# Patient Record
Sex: Male | Born: 1955 | Race: White | Hispanic: No | Marital: Married | Smoking: Former smoker
Health system: Southern US, Community
[De-identification: ages and names within clinical notes are randomized; demographics above are authoritative.]

## PROBLEM LIST (undated history)

## (undated) DIAGNOSIS — N189 Chronic kidney disease, unspecified: Secondary | ICD-10-CM

## (undated) DIAGNOSIS — I10 Essential (primary) hypertension: Secondary | ICD-10-CM

## (undated) DIAGNOSIS — C801 Malignant (primary) neoplasm, unspecified: Secondary | ICD-10-CM

## (undated) DIAGNOSIS — K219 Gastro-esophageal reflux disease without esophagitis: Secondary | ICD-10-CM

## (undated) HISTORY — PX: OTHER SURGICAL HISTORY: SHX169

---

## 2015-03-24 ENCOUNTER — Other Ambulatory Visit (HOSPITAL_COMMUNITY): Payer: Self-pay | Admitting: Urology

## 2015-03-24 DIAGNOSIS — C61 Malignant neoplasm of prostate: Secondary | ICD-10-CM

## 2015-03-26 ENCOUNTER — Other Ambulatory Visit: Payer: Self-pay | Admitting: Urology

## 2015-04-09 ENCOUNTER — Ambulatory Visit (HOSPITAL_COMMUNITY)
Admission: RE | Admit: 2015-04-09 | Discharge: 2015-04-09 | Disposition: A | Discharge status: Home / Self Care | Payer: BLUE CROSS/BLUE SHIELD | Source: Ambulatory Visit | Attending: Urology | Admitting: Urology

## 2015-04-09 DIAGNOSIS — C61 Malignant neoplasm of prostate: Secondary | ICD-10-CM | POA: Insufficient documentation

## 2015-04-09 DIAGNOSIS — K5730 Diverticulosis of large intestine without perforation or abscess without bleeding: Secondary | ICD-10-CM | POA: Insufficient documentation

## 2015-04-09 LAB — POCT I-STAT CREATININE: Creatinine, Ser: 1.4 mg/dL — ABNORMAL HIGH (ref 0.61–1.24)

## 2015-04-09 MED ORDER — GADOBENATE DIMEGLUMINE 529 MG/ML IV SOLN
20.0000 mL | Freq: Once | INTRAVENOUS | Status: AC | PRN
  Administered 2015-04-09: 19 mL via INTRAVENOUS

## 2015-04-16 NOTE — Patient Instructions (Addendum)
Dan Brandt  04/16/2015   Your procedure is scheduled on: Thursday 04/22/14  Report to [facility]  Entrance take Dan Brandt  elevators to 3rd floor to  Dan Brandt at 10:00AM.  Call this number if you have problems the morning of surgery [phone]   Remember: ONLY 1 PERSON MAY GO WITH YOU TO SHORT STAY TO GET  READY MORNING OF Dan Brandt.  Do not eat food or drink liquids :After Midnight.     Take these medicines the morning of surgery with A SIP OF WATER: Omeprazole, Lovastatin, Claritin if needed                               You may not have any metal on your body including hair pins and              piercings  Do not wear jewelry, make-up, lotions, powders or perfumes, deodorant             Do not wear nail polish.  Do not shave  48 hours prior to surgery.              Men may shave face and neck.   Do not bring valuables to the [name].  Contacts, dentures or bridgework may not be worn into surgery.  Leave suitcase in the car. After surgery it may be brought to your room.     Special Instructions: Practice deep breathing and leg exercises              Please read over the following fact sheets you were given: _____________________________________________________________________             [facility] - Preparing for Surgery Before surgery, you can play an important role.  Because skin is not sterile, your skin needs to be as free of germs as possible.  You can reduce the number of germs on your skin by washing with CHG (chlorahexidine gluconate) soap before surgery.  CHG is an antiseptic cleaner which kills germs and bonds with the skin to continue killing germs even after washing. Please DO NOT use if you have an allergy to CHG or antibacterial soaps.  If your skin becomes reddened/irritated stop using the CHG and inform your nurse when you arrive at Short Stay. Do not shave  (including legs and underarms) for at least 48 hours prior to the first CHG shower.  You may shave your face/neck. Please follow these instructions carefully:  1.  Shower with CHG Soap the night before surgery and the  morning of Surgery.  2.  If you choose to wash your hair, wash your hair first as usual with your  normal  shampoo.  3.  After you shampoo, rinse your hair and body thoroughly to remove the  shampoo.                                        4.  Use CHG as you would any other liquid soap.  You can apply chg directly  to the skin and wash  Gently with a scrungie or clean washcloth.  5.  Apply the CHG Soap to your body ONLY FROM THE NECK DOWN.   Do not use on face/ open                           Wound or open sores. Avoid contact with eyes, ears mouth and genitals (private parts).                       Wash face,  Genitals (private parts) with your normal soap.             6.  Wash thoroughly, paying special attention to the area where your surgery  will be performed.  7.  Thoroughly rinse your body with warm water from the neck down.  8.  DO NOT shower/wash with your normal soap after using and rinsing off  the CHG Soap.                9.  Pat yourself dry with a clean towel.            10.  Wear clean pajamas.            11.  Place clean sheets on your bed the night of your first shower and do not  sleep with pets. Day of Surgery : Do not apply any lotions/deodorants the morning of surgery.  Please wear clean clothes to the [facility]/surgery center.  FAILURE TO FOLLOW THESE INSTRUCTIONS MAY RESULT IN THE CANCELLATION OF YOUR SURGERY PATIENT SIGNATURE_________________________________  NURSE SIGNATURE__________________________________  ________________________________________________________________________   Dan Brandt  An incentive spirometer is a tool that can help keep your lungs clear and active. This tool measures how well you are filling  your lungs with each breath. Taking long deep breaths may help reverse or decrease the chance of developing breathing (pulmonary) problems (especially infection) following:  A long period of time when you are unable to move or be active. BEFORE THE PROCEDURE   If the spirometer includes an indicator to show your best effort, your nurse or respiratory therapist will set it to a desired goal.  If possible, sit up straight or lean slightly forward. Try not to slouch.  Hold the incentive spirometer in an upright position. INSTRUCTIONS FOR USE   Sit on the edge of your bed if possible, or sit up as far as you can in bed or on a chair.  Hold the incentive spirometer in an upright position.  Breathe out normally.  Place the mouthpiece in your mouth and seal your lips tightly around it.  Breathe in slowly and as deeply as possible, raising the piston or the ball toward the top of the column.  Hold your breath for 3-5 seconds or for as long as possible. Allow the piston or ball to fall to the bottom of the column.  Remove the mouthpiece from your mouth and breathe out normally.  Rest for a few seconds and repeat Steps 1 through 7 at least 10 times every 1-2 hours when you are awake. Take your time and take a few normal breaths between deep breaths.  The spirometer may include an indicator to show your best effort. Use the indicator as a goal to work toward during each repetition.  After each set of 10 deep breaths, practice coughing to be sure your lungs are clear. If you have an incision (the cut made at the time of surgery),  support your incision when coughing by placing a pillow or rolled up towels firmly against it. Once you are able to get out of bed, walk around indoors and cough well. You may stop using the incentive spirometer when instructed by your caregiver.  RISKS AND COMPLICATIONS  Take your time so you do not get dizzy or light-headed.  If you are in pain, you may need to  take or ask for pain medication before doing incentive spirometry. It is harder to take a deep breath if you are having pain. AFTER USE  Rest and breathe slowly and easily.  It can be helpful to keep track of a log of your progress. Your caregiver can provide you with a simple table to help with this. If you are using the spirometer at home, follow these instructions: [place] IF:   You are having difficultly using the spirometer.  You have trouble using the spirometer as often as instructed.  Your pain medication is not giving enough relief while using the spirometer.  You develop fever of 100.5 F (38.1 C) or higher. SEEK IMMEDIATE MEDICAL CARE IF:   You cough up bloody sputum that had not been present before.  You develop fever of 102 F (38.9 C) or greater.  You develop worsening pain at or near the incision site. MAKE SURE YOU:   Understand these instructions.  Will watch your condition.  Will get help right away if you are not doing well or get worse. Document Released: 08/08/2006 Document Revised: 06/20/2011 Document Reviewed: 10/09/2006 [employer] Patient Information 2014 [employer], [place].   ________________________________________________________________________  WHAT IS A BLOOD TRANSFUSION? Blood Transfusion Information  A transfusion is the replacement of blood or some of its parts. Blood is made up of multiple cells which provide different functions.  Red blood cells carry oxygen and are used for blood loss replacement.  White blood cells fight against infection.  Platelets control bleeding.  Plasma helps clot blood.  Other blood products are available for specialized needs, such as hemophilia or other clotting disorders. BEFORE THE TRANSFUSION  Who gives blood for transfusions?   Healthy volunteers who are fully evaluated to make sure their blood is safe. This is blood bank blood. Transfusion therapy is the safest it has ever been in the  practice of medicine. Before blood is taken from a donor, a complete history is taken to make sure that person has no history of diseases nor engages in risky social behavior (examples are intravenous drug use or sexual activity with multiple partners). The donor's travel history is screened to minimize risk of transmitting infections, such as malaria. The donated blood is tested for signs of infectious diseases, such as HIV and hepatitis. The blood is then tested to be sure it is compatible with you in order to minimize the chance of a transfusion reaction. If you or a relative donates blood, this is often done in anticipation of surgery and is not appropriate for emergency situations. It takes many days to process the donated blood. RISKS AND COMPLICATIONS Although transfusion therapy is very safe and saves many lives, the main dangers of transfusion include:   Getting an infectious disease.  Developing a transfusion reaction. This is an allergic reaction to something in the blood you were given. Every precaution is taken to prevent this. The decision to have a blood transfusion has been considered carefully by your caregiver before blood is given. Blood is not given unless the benefits outweigh the risks. AFTER THE TRANSFUSION  Right after receiving a blood transfusion, you will usually feel much better and more energetic. This is especially true if your red blood cells have gotten low (anemic). The transfusion raises the level of the red blood cells which carry oxygen, and this usually causes an energy increase.  The nurse administering the transfusion will monitor you carefully for complications. HOME CARE INSTRUCTIONS  No special instructions are needed after a transfusion. You may find your energy is better. Speak with your caregiver about any limitations on activity for underlying diseases you may have. SEEK MEDICAL CARE IF:   Your condition is not improving after your transfusion.  You  develop redness or irritation at the intravenous (IV) site. SEEK IMMEDIATE MEDICAL CARE IF:  Any of the following symptoms occur over the next 12 hours:  Shaking chills.  You have a temperature by mouth above 102 F (38.9 C), not controlled by medicine.  Chest, back, or muscle pain.  People around you feel you are not acting correctly or are confused.  Shortness of breath or difficulty breathing.  Dizziness and fainting.  You get a rash or develop hives.  You have a decrease in urine output.  Your urine turns a dark color or changes to pink, red, or brown. Any of the following symptoms occur over the next 10 days:  You have a temperature by mouth above 102 F (38.9 C), not controlled by medicine.  Shortness of breath.  Weakness after normal activity.  The white part of the eye turns yellow (jaundice).  You have a decrease in the amount of urine or are urinating less often.  Your urine turns a dark color or changes to pink, red, or brown. Document Released: 03/25/2000 Document Revised: 06/20/2011 Document Reviewed: 11/12/2007 [facility] Patient Information 2014 [place], [place].  _______________________________________________________________________

## 2015-04-17 ENCOUNTER — Encounter (HOSPITAL_COMMUNITY)
Admission: RE | Admit: 2015-04-17 | Discharge: 2015-04-17 | Disposition: A | Discharge status: Home / Self Care | Payer: BLUE CROSS/BLUE SHIELD | Source: Ambulatory Visit | Attending: Urology | Admitting: Urology

## 2015-04-17 ENCOUNTER — Encounter (HOSPITAL_COMMUNITY): Payer: Self-pay

## 2015-04-17 ENCOUNTER — Ambulatory Visit (HOSPITAL_COMMUNITY)
Admission: RE | Admit: 2015-04-17 | Discharge: 2015-04-17 | Disposition: A | Discharge status: Home / Self Care | Payer: BLUE CROSS/BLUE SHIELD | Source: Ambulatory Visit | Attending: Anesthesiology | Admitting: Anesthesiology

## 2015-04-17 DIAGNOSIS — I10 Essential (primary) hypertension: Secondary | ICD-10-CM | POA: Diagnosis present

## 2015-04-17 DIAGNOSIS — J439 Emphysema, unspecified: Secondary | ICD-10-CM | POA: Insufficient documentation

## 2015-04-17 HISTORY — DX: Malignant (primary) neoplasm, unspecified: C80.1

## 2015-04-17 HISTORY — DX: Gastro-esophageal reflux disease without esophagitis: K21.9

## 2015-04-17 HISTORY — DX: Essential (primary) hypertension: I10

## 2015-04-17 HISTORY — DX: Chronic kidney disease, unspecified: N18.9

## 2015-04-17 LAB — BASIC METABOLIC PANEL
Anion gap: 8 (ref 5–15)
BUN: 25 mg/dL — AB (ref 6–20)
CALCIUM: 9.3 mg/dL (ref 8.9–10.3)
CO2: 24 mmol/L (ref 22–32)
CREATININE: 1.39 mg/dL — AB (ref 0.61–1.24)
Chloride: 106 mmol/L (ref 101–111)
GFR calc Af Amer: >60 mL/min (ref >60)
GFR, EST NON AFRICAN AMERICAN: 54 mL/min — AB (ref >60)
GLUCOSE: 102 mg/dL — AB (ref 65–99)
Potassium: 4.4 mmol/L (ref 3.5–5.1)
SODIUM: 138 mmol/L (ref 135–145)

## 2015-04-17 LAB — CBC
HCT: 40.6 % (ref 39.0–52.0)
Hemoglobin: 13.7 g/dL (ref 13.0–17.0)
MCH: 29.7 pg (ref 26.0–34.0)
MCHC: 33.7 g/dL (ref 30.0–36.0)
MCV: 88.1 fL (ref 78.0–100.0)
PLATELETS: 153 10*3/uL (ref 150–400)
RBC: 4.61 MIL/uL (ref 4.22–5.81)
RDW: 12.9 % (ref 11.5–15.5)
WBC: 4.6 10*3/uL (ref 4.0–10.5)

## 2015-04-17 LAB — ABO/RH: ABO/RH(D): O POS

## 2015-04-17 NOTE — Pre-Procedure Instructions (Signed)
Pt to have EKG and CXR done at today's pre-op visit.  None in past year per pt.

## 2015-04-17 NOTE — Anesthesia Preprocedure Evaluation (Addendum)
Anesthesia Evaluation  Patient identified by MRN, date of birth, ID band Patient awake    Reviewed: Allergy & Precautions, NPO status , Patient's Chart, lab work & pertinent test results  Airway Mallampati: II   Neck ROM: Full    Dental  (+) Dental Advisory Given, Teeth Intact   Pulmonary former smoker (quit 1985),    breath sounds clear to auscultation       Cardiovascular hypertension, Pt. on medications  Rhythm:Regular  EKG 04/2015 mild LVH   Neuro/Psych negative neurological ROS  negative psych ROS   GI/Hepatic Neg liver ROS, GERD  ,  Endo/Other  negative endocrine ROS  Renal/GU Crest 1.4   Prostate CA    Musculoskeletal   Abdominal   Peds  Hematology 13/40   Anesthesia Other Findings   Reproductive/Obstetrics                           Anesthesia Physical Anesthesia Plan  ASA: II  Anesthesia Plan: General   Post-op Pain Management:    Induction: Intravenous  Airway Management Planned: Oral ETT  Additional Equipment:   Intra-op Plan:   Post-operative Plan: Extubation in OR  Informed Consent: I have reviewed the patients History and Physical, chart, labs and discussed the procedure including the risks, benefits and alternatives for the proposed anesthesia with the patient or authorized representative who has indicated his/her understanding and acceptance.     Plan Discussed with:   Anesthesia Plan Comments: (2nd IV after induction)        Anesthesia Quick Evaluation

## 2015-04-17 NOTE — Progress Notes (Signed)
   04/17/15 0830  OBSTRUCTIVE SLEEP APNEA  Have you ever been diagnosed with sleep apnea through a sleep study? No  Do you snore loudly (loud enough to be heard through closed doors)?  1  Do you often feel tired, fatigued, or sleepy during the daytime (such as falling asleep during driving or talking to someone)? 0  Has anyone observed you stop breathing during your sleep? 1  Do you have, or are you being treated for high blood pressure? 1  BMI more than 35 kg/m2? 0  Age > 50 (1-yes) 1  Neck circumference greater than:Male 16 inches or larger, Male 17inches or larger? 0  Male Gender (Yes=1) 1  Obstructive Sleep Apnea Score 5

## 2015-04-22 NOTE — H&P (Signed)
Chief Complaint Prostate Cancer   Reason For Visit Reason for consult: To discuss treatment options for high risk prostate cancer.  Physician requesting consult: Dr. Clyde Lundborg  PCP: Dr. Kern Alberta   History of Present Illness Dan Brandt is a 60 year old gentleman who was found to have an elevated PSA of 7.5 and a left sided prostate nodule prompting a TRUS biopsy of the prostate by Dr. Exie Parody on 02/10/15. 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 He underwent staging studies on 02/24/15 including a bone scan and CT scan. His CT scan demonstrated a 7 mm right common iliac lymph node without pathologically enlarged lymph nodes or other evidence for metastatic disease. 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 He apparently was already started on combined androgen blockade with Lupron 30 mg and bicalutamide 50 mg daily. He has been tolerating therapy relatively well without hot flashes but has had episodes of feeling depressed, significant weight gain, and fatigue/energy loss.     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 Both his parents are still living in one of his main goals is to ensure that he remains viable enough to care for them throughout the remainder of their lives.    TNM stage: cT3a N0 M0  PSA: 7.5  Gleason score: 5+5=10  Biopsy (02/10/15): 13/16 cores positive    Left: 7/8 cores positive with Gleason 8,9, and 10 disease present with high volume percentages and PNI    Right: 6/8 cores positive with Gleason 8,9, and 10 disease present with 20-80% involvement  Prostate volume: 38.2  cc    Nomogram  OC disease: 7%  EPE: 90%  SVI: 50%  LNI: 51%  PFS (surgery): 22% at 4 years, 13% at 9 years    Urinary function: He does have significant lower urinary tract symptoms including nocturia, weak stream, urgency, frequency, and incomplete emptying. IPSS is 27. The symptoms are significantly bothersome to him.  Erectile function: He is sexually inactive. He does have erectile dysfunction. SHIM score is 1.   Past Medical History Problems  1. History of esophageal reflux (Z87.19) 2. History of hyperlipidemia (Z86.39) 3. History of hypertension (Z86.79)  Surgical History Problems  1. History of No Surgical Problems  Current Meds 1. Bicalutamide 50 MG Oral Tablet;  Therapy: (Recorded:12Dec2016) to Recorded 2. Claritin TABS;  Therapy: (Recorded:13Dec2016) to Recorded 3. Lisinopril 20 MG Oral Tablet;  Therapy: (Recorded:12Dec2016) to Recorded 4. Lovastatin 40 MG Oral Tablet;  Therapy: (Recorded:12Dec2016) to Recorded 5. Omeprazole 20 MG Oral Tablet Delayed Release;  Therapy: (Recorded:12Dec2016) to Recorded 6. Tamsulosin HCl - 0.4 MG Oral Capsule;  Therapy: (Recorded:12Dec2016) to Recorded  Allergies Medication  1. No Known Drug Allergies  Family History Problems  1. Family history of cardiac disorder (Z82.49) : Mother 2. Family history of kidney stones (Z84.1) 3. Family history of stroke (Z82.3) : Father  Social History Problems    Denied: History of Alcohol use   Former smoker [phone])   smoked 1 ppd for 10 years; quit 25 years ago   Number of children   2 sons; 2 daughters   Occupation   Press photographer  Review  of Systems Genitourinary, constitutional, skin, eye, otolaryngeal, hematologic/lymphatic, cardiovascular, pulmonary, endocrine, musculoskeletal, gastrointestinal, neurological and psychiatric system(s) were reviewed and pertinent findings if present are noted and are otherwise negative.  Constitutional: feeling tired (fatigue).   Endocrine: polydipsia.  Musculoskeletal: joint pain.    Vitals Vital Signs [Data Includes: Last 1 Day]  Recorded: 12Dec2016 01:42PM  Height: 5 ft 8 in Weight: 200 lb  BMI Calculated: 30.41 BSA Calculated: 2.04 Blood Pressure: 147 / 71 Temperature: 98.4 F Heart Rate: 62  Physical Exam Constitutional: Well nourished and well developed . No acute distress.  ENT:. The ears and nose are normal in appearance.  Neck: The appearance of the neck is normal and no neck mass is present.  Pulmonary: No respiratory distress, normal respiratory rhythm and effort and clear bilateral breath sounds.  Cardiovascular: Heart rate and rhythm are normal . No peripheral edema.  Abdomen: The abdomen is soft and nontender. No masses are palpated. No CVA tenderness. No hernias are palpable. No hepatosplenomegaly noted.  Rectal: Prostate size is estimated to be 40 g. He has a very concerning digital rectal exam. He has nodularity of the entire left side of the prostate that extends into the extraprostatic tissues. It is difficult to determine whether his mass extends toward the pelvic sidewall based on my inability to completely examine him digitally.  Genitourinary: Examination of the penis demonstrates no discharge, no masses, no lesions and a normal meatus. The scrotum is without lesions. The right epididymis is palpably normal and non-tender. The left epididymis is palpably normal and non-tender. The right testis is non-tender and without masses. The left testis is non-tender and without masses.  Lymphatics: The femoral and inguinal nodes are not enlarged or tender.  Skin: Normal skin turgor, no visible rash and no visible skin lesions.  Neuro/Psych:. Mood and affect are appropriate.    Results/Data Urine [Data Includes: Last 1 Day]   [id number]  COLOR YELLOW   APPEARANCE CLEAR   SPECIFIC GRAVITY 1.025   pH 5.0   GLUCOSE NEGATIVE   BILIRUBIN NEGATIVE   KETONE NEGATIVE   BLOOD TRACE   PROTEIN NEGATIVE    NITRITE NEGATIVE   LEUKOCYTE ESTERASE NEGATIVE   SQUAMOUS EPITHELIAL/HPF 0-5 HPF  WBC NONE SEEN WBC/HPF  RBC 0-2 RBC/HPF  BACTERIA NONE SEEN HPF  CRYSTALS NONE SEEN HPF  CASTS NONE SEEN LPF  Yeast NONE SEEN HPF   I have reviewed his medical records, PSA results, and pathology report. I have independently reviewed his bone scan and CT scan today. Findings are as dictated above.   Assessment Assessed  1. Prostate cancer (C61)  Plan Health Maintenance  1. UA With REFLEX; [Do Not Release]; Status:Complete;   Done: [id number] 01:01PM Prostate cancer  2. Start: DiazePAM 10 MG Oral Tablet; TAKE 1 TABLET Once Take 1 hour prior to MRI 3. Follow-up Office  Follow-up  Status: Hold For - Date of Service  Requested for:  13Dec2016 4. Radiation Oncology Referral Referral  Referral - Pt prefers to see Dr. Tammi Klippel in [place] if  possible but otherwise is open to seeing him in [place] if that is the only option.   Status: Hold For - Appointment,Records  Requested for: 13Dec2016 5. CREATININE with eGFR; Status:In Progress - Specimen/Data Collected;   Done:  [id number] 6. VENIPUNCTURE; Status:Complete;   Done: 30SPQ3300  Discussion/Summary 1. Locally advanced prostate cancer: I had a long and detailed discussion today with Mr. Thong. He understands that while encouraging that no measurable metastatic disease has been identified  on his staging studies, he remains at very high risk for micrometastatic disease based on his disease parameters and high volume/high grade disease noted on biopsy. He also understands that his digital rectal exam is highly concerning for extraprostatic disease indicative of at least locally advanced prostate cancer. We discussed the low likelihood that he could be cured with surgery alone. However, he does have very severe voiding symptoms and understands that primary surgical therapy as part of a multimodality approach that may include radiation and androgen deprivation is  reasonable to consider based on his life expectancy, excellent overall health, and primary goal of longevity. We did discuss the option of proceeding with continued androgen deprivation therapy with external beam radiation therapy has an equivalent treatment possibly with less morbidity. However, he understands that he likely would benefit from at least a channel TURP prior to radiation therapy if he chose the latter approach in order to treat his lower urinary tract symptoms which are quite significant.   The patient was counseled about the natural history of prostate cancer and the standard treatment options that are available for prostate cancer. It was explained to him how his age and life expectancy, clinical stage, Gleason score, and PSA affect his prognosis, the decision to proceed with additional staging studies, as well as how that information influences recommended treatment strategies. We discussed the roles for active surveillance, radiation therapy, surgical therapy, androgen deprivation, as well as ablative therapy options for the treatment of prostate cancer as appropriate to his individual cancer situation. We discussed the risks and benefits of these options with regard to their impact on cancer control and also in terms of potential adverse events, complications, and impact on quiality of life particularly related to urinary, bowel, and sexual function. The patient was encouraged to ask questions throughout the discussion today and all questions were answered to his stated satisfaction. In addition, the patient was provided with and/or directed to appropriate resources and literature for further education about prostate cancer and treatment options.   We discussed surgical therapy for prostate cancer including the different available surgical approaches. We discussed, in detail, the risks and expectations of surgery with regard to cancer control, urinary control, and erectile function as well as  the expected postoperative recovery process. Additional risks of surgery including but not limited to bleeding, infection, hernia formation, nerve damage, lymphocele formation, bowel/rectal injury potentially necessitating colostomy, damage to the urinary tract resulting in urine leakage, urethral stricture, and the cardiopulmonary risks such as myocardial infarction, stroke, death, venothromboembolism, etc. were explained. The risk of open surgical conversion for robotic/laparoscopic prostatectomy was also discussed.     After a very detailed and long discussion, Mr. Soler wishes to be very aggressive in treatment of his prostate cancer with his goal of optimizing his longevity. He understands the potential morbidity associated with primary surgery followed by possible radiation therapy and possible androgen deprivation therapy. He also understands the option of pursuing a clinical trial at another institution. Considering the very worrisome rectal exam, I have recommended that he proceed with an endorectal MRI to further evaluate the local advancement of his prostate cancer and to ensure that there is no invasion of the pelvic sidewall that would render him unresectable. He expresses understanding and would like to tentatively be scheduled for a non-nerve sparing robot-assisted laparoscopic radical prostatectomy and extended bilateral pelvic lymphadenectomy. I have recommended that he proceed with a radiation oncology consultation but to consider the option of primary radiation therapy in conjunction with  long-term androgen deprivation but also understanding that he almost definitely will require adjuvant radiation therapy following primary surgery. This will be scheduled with Dr. Tammi Klippel preferably in [place] closer to home. He understands that if his MRI with suggest that he is not resectable, then I would favor proceeding with a channel TURP followed by radiation therapy and would continue him on androgen  deprivation. However, if he undergoes primary surgical therapy, I would not necessarily continue androgen deprivation therapy pending his surgical pathology results.     Cc: Dr. Clyde Lundborg  Dr. Kern Alberta  Dr. [name]  A total of 75 minutes were spent in the overall care of the patient today with 63 minutes in direct face to face consultation.    Amendment  I spoke with Mr. Faulkenberry and reviewed his MRI with him which demonstrates a very large tumor in the prostate with L EPE and B SVI. We discussed this extensively and he currently wishes to still proceed with primary surgery understanding that he almost assuredly will need adjuvant therapy. He has an appt with Dr. Tammi Klippel and will let me know if he changes his mind about proceeding with surgery.1     1 Amended By: Raynelle Bring; Apr 10 2015 10:07 AM EST  Signatures Electronically signed by : Raynelle Bring, M.D.; Apr 10 2015 10:09AM EST

## 2015-04-23 ENCOUNTER — Inpatient Hospital Stay (HOSPITAL_COMMUNITY)
Admission: RE | Admit: 2015-04-23 | Discharge: 2015-04-24 | DRG: 708 | Disposition: A | Discharge status: Home / Self Care | Payer: BLUE CROSS/BLUE SHIELD | Source: Ambulatory Visit | Attending: Urology | Admitting: Urology

## 2015-04-23 ENCOUNTER — Inpatient Hospital Stay (HOSPITAL_COMMUNITY): Payer: BLUE CROSS/BLUE SHIELD | Admitting: Anesthesiology

## 2015-04-23 ENCOUNTER — Encounter (HOSPITAL_COMMUNITY): Payer: Self-pay | Admitting: *Deleted

## 2015-04-23 ENCOUNTER — Encounter (HOSPITAL_COMMUNITY)
Admission: RE | Disposition: A | Discharge status: Home / Self Care | Payer: Self-pay | Source: Ambulatory Visit | Attending: Urology

## 2015-04-23 DIAGNOSIS — Z823 Family history of stroke: Secondary | ICD-10-CM | POA: Diagnosis not present

## 2015-04-23 DIAGNOSIS — Z8249 Family history of ischemic heart disease and other diseases of the circulatory system: Secondary | ICD-10-CM

## 2015-04-23 DIAGNOSIS — N529 Male erectile dysfunction, unspecified: Secondary | ICD-10-CM | POA: Diagnosis present

## 2015-04-23 DIAGNOSIS — Z87891 Personal history of nicotine dependence: Secondary | ICD-10-CM

## 2015-04-23 DIAGNOSIS — C61 Malignant neoplasm of prostate: Principal | ICD-10-CM | POA: Diagnosis present

## 2015-04-23 DIAGNOSIS — Z79899 Other long term (current) drug therapy: Secondary | ICD-10-CM

## 2015-04-23 DIAGNOSIS — R351 Nocturia: Secondary | ICD-10-CM | POA: Diagnosis present

## 2015-04-23 DIAGNOSIS — R3912 Poor urinary stream: Secondary | ICD-10-CM | POA: Diagnosis present

## 2015-04-23 DIAGNOSIS — K219 Gastro-esophageal reflux disease without esophagitis: Secondary | ICD-10-CM | POA: Diagnosis present

## 2015-04-23 DIAGNOSIS — I10 Essential (primary) hypertension: Secondary | ICD-10-CM | POA: Diagnosis present

## 2015-04-23 HISTORY — PX: LYMPHADENECTOMY: SHX5960

## 2015-04-23 HISTORY — PX: ROBOT ASSISTED LAPAROSCOPIC RADICAL PROSTATECTOMY: SHX5141

## 2015-04-23 LAB — HEMOGLOBIN AND HEMATOCRIT, BLOOD
HEMATOCRIT: 38.4 % — AB (ref 39.0–52.0)
HEMOGLOBIN: 13 g/dL (ref 13.0–17.0)

## 2015-04-23 LAB — TYPE AND SCREEN
ABO/RH(D): O POS
ANTIBODY SCREEN: NEGATIVE

## 2015-04-23 SURGERY — ROBOTIC ASSISTED LAPAROSCOPIC RADICAL PROSTATECTOMY LEVEL 3
Anesthesia: General

## 2015-04-23 MED ORDER — PANTOPRAZOLE SODIUM 40 MG PO TBEC
40.0000 mg | DELAYED_RELEASE_TABLET | Freq: Every day | ORAL | Status: DC
  Administered 2015-04-24: 40 mg via ORAL
  Filled 2015-04-23: qty 1

## 2015-04-23 MED ORDER — MEPERIDINE HCL 50 MG/ML IJ SOLN: 6.2500 mg | INTRAMUSCULAR | Status: DC | PRN

## 2015-04-23 MED ORDER — LACTATED RINGERS IV SOLN
INTRAVENOUS | Status: DC | PRN
  Administered 2015-04-23: 14:00:00

## 2015-04-23 MED ORDER — DIPHENHYDRAMINE HCL 12.5 MG/5ML PO ELIX: 12.5000 mg | ORAL_SOLUTION | Freq: Four times a day (QID) | ORAL | Status: DC | PRN

## 2015-04-23 MED ORDER — ACETAMINOPHEN 10 MG/ML IV SOLN
INTRAVENOUS | Status: AC
  Filled 2015-04-23: qty 100

## 2015-04-23 MED ORDER — MORPHINE SULFATE (PF) 2 MG/ML IV SOLN: 2.0000 mg | INTRAVENOUS | Status: DC | PRN

## 2015-04-23 MED ORDER — LIDOCAINE HCL (CARDIAC) 20 MG/ML IV SOLN
INTRAVENOUS | Status: AC
  Filled 2015-04-23: qty 5

## 2015-04-23 MED ORDER — EPHEDRINE SULFATE 50 MG/ML IJ SOLN
INTRAMUSCULAR | Status: DC | PRN
  Administered 2015-04-23: 10 mg via INTRAVENOUS

## 2015-04-23 MED ORDER — EPHEDRINE SULFATE 50 MG/ML IJ SOLN
INTRAMUSCULAR | Status: AC
  Filled 2015-04-23: qty 1

## 2015-04-23 MED ORDER — DEXAMETHASONE SODIUM PHOSPHATE 10 MG/ML IJ SOLN
INTRAMUSCULAR | Status: DC | PRN
  Administered 2015-04-23: 10 mg via INTRAVENOUS

## 2015-04-23 MED ORDER — HYDROMORPHONE HCL 1 MG/ML IJ SOLN
INTRAMUSCULAR | Status: AC
  Filled 2015-04-23: qty 1

## 2015-04-23 MED ORDER — FENTANYL CITRATE (PF) 100 MCG/2ML IJ SOLN
INTRAMUSCULAR | Status: AC
  Filled 2015-04-23: qty 2

## 2015-04-23 MED ORDER — ROCURONIUM BROMIDE 100 MG/10ML IV SOLN
INTRAVENOUS | Status: AC
  Filled 2015-04-23: qty 1

## 2015-04-23 MED ORDER — LACTATED RINGERS IV SOLN
INTRAVENOUS | Status: DC
  Administered 2015-04-23: 17:00:00 via INTRAVENOUS
  Administered 2015-04-23: 1000 mL via INTRAVENOUS
  Administered 2015-04-23: 18:00:00 via INTRAVENOUS

## 2015-04-23 MED ORDER — PROMETHAZINE HCL 25 MG/ML IJ SOLN: 6.2500 mg | INTRAMUSCULAR | Status: DC | PRN

## 2015-04-23 MED ORDER — DOCUSATE SODIUM 100 MG PO CAPS
100.0000 mg | ORAL_CAPSULE | Freq: Two times a day (BID) | ORAL | Status: DC
  Administered 2015-04-23 – 2015-04-24 (×2): 100 mg via ORAL
  Filled 2015-04-23 (×2): qty 1

## 2015-04-23 MED ORDER — BUPIVACAINE-EPINEPHRINE (PF) 0.25% -1:200000 IJ SOLN
INTRAMUSCULAR | Status: AC
  Filled 2015-04-23: qty 30

## 2015-04-23 MED ORDER — HYDROMORPHONE HCL 1 MG/ML IJ SOLN
INTRAMUSCULAR | Status: DC | PRN
  Administered 2015-04-23 (×5): .4 mg via INTRAVENOUS

## 2015-04-23 MED ORDER — CEFAZOLIN SODIUM 1-5 GM-% IV SOLN
1.0000 g | Freq: Three times a day (TID) | INTRAVENOUS | Status: AC
  Administered 2015-04-23 – 2015-04-24 (×2): 1 g via INTRAVENOUS
  Filled 2015-04-23 (×2): qty 50

## 2015-04-23 MED ORDER — STERILE WATER FOR IRRIGATION IR SOLN
Status: DC | PRN
  Administered 2015-04-23: 1000 mL

## 2015-04-23 MED ORDER — KCL IN DEXTROSE-NACL 20-5-0.45 MEQ/L-%-% IV SOLN
INTRAVENOUS | Status: DC
  Administered 2015-04-23 – 2015-04-24 (×2): via INTRAVENOUS
  Filled 2015-04-23 (×4): qty 1000

## 2015-04-23 MED ORDER — KETOROLAC TROMETHAMINE 15 MG/ML IJ SOLN
15.0000 mg | Freq: Four times a day (QID) | INTRAMUSCULAR | Status: DC
  Administered 2015-04-23 – 2015-04-24 (×3): 15 mg via INTRAVENOUS
  Filled 2015-04-23 (×4): qty 1

## 2015-04-23 MED ORDER — GLYCOPYRROLATE 0.2 MG/ML IJ SOLN
INTRAMUSCULAR | Status: AC
  Filled 2015-04-23: qty 3

## 2015-04-23 MED ORDER — ACETAMINOPHEN 10 MG/ML IV SOLN
1000.0000 mg | Freq: Once | INTRAVENOUS | Status: AC
  Administered 2015-04-23: 1000 mg via INTRAVENOUS

## 2015-04-23 MED ORDER — LIDOCAINE HCL (CARDIAC) 20 MG/ML IV SOLN
INTRAVENOUS | Status: DC | PRN
  Administered 2015-04-23: 80 mg via INTRATRACHEAL

## 2015-04-23 MED ORDER — HEPARIN SODIUM (PORCINE) 1000 UNIT/ML IJ SOLN
INTRAMUSCULAR | Status: AC
  Filled 2015-04-23: qty 1

## 2015-04-23 MED ORDER — FENTANYL CITRATE (PF) 250 MCG/5ML IJ SOLN
INTRAMUSCULAR | Status: DC | PRN
  Administered 2015-04-23 (×9): 50 ug via INTRAVENOUS

## 2015-04-23 MED ORDER — HYDROCODONE-ACETAMINOPHEN 5-325 MG PO TABS: 1.0000 | ORAL_TABLET | Freq: Four times a day (QID) | ORAL | Status: DC | PRN

## 2015-04-23 MED ORDER — MIDAZOLAM HCL 5 MG/5ML IJ SOLN
INTRAMUSCULAR | Status: DC | PRN
  Administered 2015-04-23: 2 mg via INTRAVENOUS

## 2015-04-23 MED ORDER — SODIUM CHLORIDE 0.9 % IV BOLUS (SEPSIS)
1000.0000 mL | Freq: Once | INTRAVENOUS | Status: AC
  Administered 2015-04-23: 1000 mL via INTRAVENOUS

## 2015-04-23 MED ORDER — HYDROMORPHONE HCL 2 MG/ML IJ SOLN
INTRAMUSCULAR | Status: AC
  Filled 2015-04-23: qty 1

## 2015-04-23 MED ORDER — MIDAZOLAM HCL 2 MG/2ML IJ SOLN
INTRAMUSCULAR | Status: AC
  Filled 2015-04-23: qty 2

## 2015-04-23 MED ORDER — GLYCOPYRROLATE 0.2 MG/ML IJ SOLN
INTRAMUSCULAR | Status: DC | PRN
  Administered 2015-04-23: 0.4 mg via INTRAVENOUS

## 2015-04-23 MED ORDER — ROCURONIUM BROMIDE 100 MG/10ML IV SOLN
INTRAVENOUS | Status: DC | PRN
  Administered 2015-04-23: 10 mg via INTRAVENOUS
  Administered 2015-04-23 (×2): 20 mg via INTRAVENOUS
  Administered 2015-04-23: 10 mg via INTRAVENOUS
  Administered 2015-04-23: 50 mg via INTRAVENOUS
  Administered 2015-04-23: 10 mg via INTRAVENOUS

## 2015-04-23 MED ORDER — SULFAMETHOXAZOLE-TRIMETHOPRIM 800-160 MG PO TABS: 1.0000 | ORAL_TABLET | Freq: Two times a day (BID) | ORAL | Status: DC

## 2015-04-23 MED ORDER — ONDANSETRON HCL 4 MG/2ML IJ SOLN
4.0000 mg | Freq: Once | INTRAMUSCULAR | Status: AC
  Administered 2015-04-23: 4 mg via INTRAVENOUS
  Filled 2015-04-23: qty 2

## 2015-04-23 MED ORDER — PROPOFOL 10 MG/ML IV BOLUS
INTRAVENOUS | Status: DC | PRN
  Administered 2015-04-23: 150 mg via INTRAVENOUS

## 2015-04-23 MED ORDER — GLYCOPYRROLATE 0.2 MG/ML IJ SOLN: INTRAMUSCULAR | Status: DC | PRN

## 2015-04-23 MED ORDER — ACETAMINOPHEN 325 MG PO TABS: 650.0000 mg | ORAL_TABLET | ORAL | Status: DC | PRN

## 2015-04-23 MED ORDER — ONDANSETRON HCL 4 MG/2ML IJ SOLN
INTRAMUSCULAR | Status: DC | PRN
  Administered 2015-04-23: 4 mg via INTRAVENOUS

## 2015-04-23 MED ORDER — DEXAMETHASONE SODIUM PHOSPHATE 10 MG/ML IJ SOLN
INTRAMUSCULAR | Status: AC
  Filled 2015-04-23: qty 1

## 2015-04-23 MED ORDER — CEFAZOLIN SODIUM-DEXTROSE 2-3 GM-% IV SOLR
2.0000 g | INTRAVENOUS | Status: AC
  Administered 2015-04-23: 2 g via INTRAVENOUS

## 2015-04-23 MED ORDER — DIPHENHYDRAMINE HCL 50 MG/ML IJ SOLN: 12.5000 mg | Freq: Four times a day (QID) | INTRAMUSCULAR | Status: DC | PRN

## 2015-04-23 MED ORDER — HYDROMORPHONE HCL 1 MG/ML IJ SOLN
0.2500 mg | INTRAMUSCULAR | Status: DC | PRN
  Administered 2015-04-23: 0.25 mg via INTRAVENOUS
  Administered 2015-04-23: 0.5 mg via INTRAVENOUS
  Administered 2015-04-23 (×3): 0.25 mg via INTRAVENOUS
  Administered 2015-04-23: 0.5 mg via INTRAVENOUS

## 2015-04-23 MED ORDER — BUPIVACAINE-EPINEPHRINE 0.25% -1:200000 IJ SOLN
INTRAMUSCULAR | Status: DC | PRN
  Administered 2015-04-23: 30 mL

## 2015-04-23 MED ORDER — PROPOFOL 10 MG/ML IV BOLUS
INTRAVENOUS | Status: AC
Start: 2015-04-23 — End: 2015-04-23
  Filled 2015-04-23: qty 20

## 2015-04-23 MED ORDER — LORATADINE 10 MG PO TABS
10.0000 mg | ORAL_TABLET | Freq: Every day | ORAL | Status: DC
  Administered 2015-04-24: 10 mg via ORAL
  Filled 2015-04-23: qty 1

## 2015-04-23 MED ORDER — SUGAMMADEX SODIUM 200 MG/2ML IV SOLN
INTRAVENOUS | Status: DC | PRN
  Administered 2015-04-23: 200 mg via INTRAVENOUS

## 2015-04-23 MED ORDER — GLYCOPYRROLATE 0.2 MG/ML IJ SOLN
INTRAMUSCULAR | Status: AC
Start: 2015-04-23 — End: 2015-04-23
  Filled 2015-04-23: qty 1

## 2015-04-23 MED ORDER — HYDROMORPHONE HCL 1 MG/ML IJ SOLN
INTRAMUSCULAR | Status: AC
Start: 2015-04-23 — End: 2015-04-24
  Filled 2015-04-23: qty 1

## 2015-04-23 MED ORDER — SODIUM CHLORIDE 0.9 % IR SOLN
Status: DC | PRN
  Administered 2015-04-23: 1 via INTRAVESICAL

## 2015-04-23 MED ORDER — LACTATED RINGERS IV SOLN
INTRAVENOUS | Status: DC
  Administered 2015-04-23: 1000 mL via INTRAVENOUS

## 2015-04-23 MED ORDER — ONDANSETRON HCL 4 MG/2ML IJ SOLN
INTRAMUSCULAR | Status: AC
  Filled 2015-04-23: qty 2

## 2015-04-23 MED ORDER — FENTANYL CITRATE (PF) 250 MCG/5ML IJ SOLN
INTRAMUSCULAR | Status: AC
  Filled 2015-04-23: qty 5

## 2015-04-23 MED ORDER — SUGAMMADEX SODIUM 200 MG/2ML IV SOLN
INTRAVENOUS | Status: AC
  Filled 2015-04-23: qty 2

## 2015-04-23 MED ORDER — MENTHOL 3 MG MT LOZG
1.0000 | LOZENGE | OROMUCOSAL | Status: DC | PRN
  Administered 2015-04-23: 3 mg via ORAL
  Filled 2015-04-23: qty 9

## 2015-04-23 MED ORDER — PRAVASTATIN SODIUM 20 MG PO TABS
20.0000 mg | ORAL_TABLET | Freq: Every day | ORAL | Status: DC
  Filled 2015-04-23: qty 1

## 2015-04-23 MED ORDER — FENTANYL CITRATE (PF) 100 MCG/2ML IJ SOLN: 25.0000 ug | INTRAMUSCULAR | Status: DC | PRN

## 2015-04-23 MED ORDER — CEFAZOLIN SODIUM-DEXTROSE 2-3 GM-% IV SOLR
INTRAVENOUS | Status: AC
  Filled 2015-04-23: qty 50

## 2015-04-23 SURGICAL SUPPLY — 45 items
APPLICATOR COTTON TIP 6IN STRL (MISCELLANEOUS) ×3 IMPLANT
CATH FOLEY 2WAY SLVR 18FR 30CC (CATHETERS) ×3 IMPLANT
CATH ROBINSON RED A/P 16FR (CATHETERS) ×3 IMPLANT
CATH ROBINSON RED A/P 8FR (CATHETERS) ×3 IMPLANT
CATH TIEMANN FOLEY 18FR 5CC (CATHETERS) ×3 IMPLANT
CHLORAPREP W/TINT 26ML (MISCELLANEOUS) ×3 IMPLANT
CLIP LIGATING HEM O LOK PURPLE (MISCELLANEOUS) ×15 IMPLANT
COVER SURGICAL LIGHT HANDLE (MISCELLANEOUS) ×3 IMPLANT
COVER TIP SHEARS 8 DVNC (MISCELLANEOUS) ×2 IMPLANT
COVER TIP SHEARS 8MM DA VINCI (MISCELLANEOUS) ×1
CUTTER ECHEON FLEX ENDO 45 340 (ENDOMECHANICALS) ×3 IMPLANT
DECANTER SPIKE VIAL GLASS SM (MISCELLANEOUS) ×3 IMPLANT
DRAPE COLUMN DVNC XI (DISPOSABLE) ×2 IMPLANT
DRAPE DA VINCI XI COLUMN (DISPOSABLE) ×1
DRAPE SURG IRRIG POUCH 19X23 (DRAPES) ×3 IMPLANT
DRSG TEGADERM 4X4.75 (GAUZE/BANDAGES/DRESSINGS) ×3 IMPLANT
ELECT REM PT RETURN 9FT ADLT (ELECTROSURGICAL) ×3
ELECTRODE REM PT RTRN 9FT ADLT (ELECTROSURGICAL) ×2 IMPLANT
GAUZE SPONGE 2X2 8PLY STRL LF (GAUZE/BANDAGES/DRESSINGS) ×2 IMPLANT
GLOVE BIO SURGEON STRL SZ 6.5 (GLOVE) ×3 IMPLANT
GLOVE BIOGEL M STRL SZ7.5 (GLOVE) ×6 IMPLANT
GOWN STRL REUS W/TWL LRG LVL3 (GOWN DISPOSABLE) ×9 IMPLANT
HOLDER FOLEY CATH W/STRAP (MISCELLANEOUS) ×3 IMPLANT
IV LACTATED RINGERS 1000ML (IV SOLUTION) ×3 IMPLANT
LIQUID BAND (GAUZE/BANDAGES/DRESSINGS) ×3 IMPLANT
NDL SAFETY ECLIPSE 18X1.5 (NEEDLE) ×2 IMPLANT
NEEDLE HYPO 18GX1.5 SHARP (NEEDLE) ×1
PACK ROBOT UROLOGY CUSTOM (CUSTOM PROCEDURE TRAY) ×3 IMPLANT
RELOAD GREEN ECHELON 45 (STAPLE) ×3 IMPLANT
SET TUBE IRRIG SUCTION NO TIP (IRRIGATION / IRRIGATOR) ×3 IMPLANT
SOLUTION ELECTROLUBE (MISCELLANEOUS) ×3 IMPLANT
SPONGE GAUZE 2X2 STER 10/PKG (GAUZE/BANDAGES/DRESSINGS) ×1
SUT ETHILON 3 0 PS 1 (SUTURE) ×3 IMPLANT
SUT MNCRL 3 0 RB1 (SUTURE) ×2 IMPLANT
SUT MNCRL 3 0 VIOLET RB1 (SUTURE) ×2 IMPLANT
SUT MNCRL AB 4-0 PS2 18 (SUTURE) ×6 IMPLANT
SUT MONOCRYL 3 0 RB1 (SUTURE) ×2
SUT VIC AB 0 UR5 27 (SUTURE) ×3 IMPLANT
SUT VIC AB 2-0 SH 27 (SUTURE) ×2
SUT VIC AB 2-0 SH 27X BRD (SUTURE) ×4 IMPLANT
SUT VICRYL 0 UR6 27IN ABS (SUTURE) ×6 IMPLANT
SYR 27GX1/2 1ML LL SAFETY (SYRINGE) ×3 IMPLANT
TOWEL OR 17X26 10 PK STRL BLUE (TOWEL DISPOSABLE) ×3 IMPLANT
TOWEL OR NON WOVEN STRL DISP B (DISPOSABLE) ×3 IMPLANT
WATER STERILE IRR 1500ML POUR (IV SOLUTION) ×6 IMPLANT

## 2015-04-23 NOTE — Transfer of Care (Signed)
Immediate Anesthesia Transfer of Care Note  Patient: Dan Brandt  Procedure(s) Performed: Procedure(s): ROBOTIC ASSISTED LAPAROSCOPIC RADICAL PROSTATECTOMY LEVEL 3 (N/A) PELVIC LYMPHADENECTOMY (Bilateral)  Patient Location: PACU  Anesthesia Type:General  Level of Consciousness:  sedated, patient cooperative and responds to stimulation  Airway & Oxygen Therapy:Patient Spontanous Breathing and Patient connected to face mask oxgen  Post-op Assessment:  Report given to PACU RN and Post -op Vital signs reviewed and stable  Post vital signs:  Reviewed and stable  Last Vitals:  Filed Vitals:   04/23/15 0938  BP: 150/86  Pulse: 67  Temp: 36.7 C  Resp: 18    Complications: No apparent anesthesia complications

## 2015-04-23 NOTE — Progress Notes (Signed)
Dr. Alinda Money informed that patient ate solid food yesterday, took 6 Dulcolx tablets 04/22/15 and Fleet enema HS and this AM

## 2015-04-23 NOTE — Anesthesia Postprocedure Evaluation (Signed)
Anesthesia Post Note  Patient: Dan Brandt  Procedure(s) Performed: Procedure(s) (LRB): ROBOTIC ASSISTED LAPAROSCOPIC RADICAL PROSTATECTOMY LEVEL 3 (N/A) PELVIC LYMPHADENECTOMY (Bilateral)  Patient location during evaluation: PACU Anesthesia Type: General Level of consciousness: awake and alert and patient cooperative Pain management: pain level controlled Vital Signs Assessment: post-procedure vital signs reviewed and stable Respiratory status: spontaneous breathing and respiratory function stable Cardiovascular status: stable Anesthetic complications: no    Last Vitals:  Filed Vitals:   04/23/15 1815 04/23/15 1840  BP:    Pulse:  85  Temp: 36.4 C   Resp:  13    Last Pain:  Filed Vitals:   04/23/15 1843  PainSc: [place]

## 2015-04-23 NOTE — Interval H&P Note (Signed)
History and Physical Interval Note:  04/23/2015 12:42 PM  Dan Brandt  has presented today for surgery, with the diagnosis of PROSTATE CANCER  The various methods of treatment have been discussed with the patient and family. After consideration of risks, benefits and other options for treatment, the patient has consented to  Procedure(s): ROBOTIC ASSISTED LAPAROSCOPIC RADICAL PROSTATECTOMY LEVEL 3 (N/A) PELVIC LYMPHADENECTOMY (Bilateral) as a surgical intervention .  The patient's history has been reviewed, patient examined, no change in status, stable for surgery.  I have reviewed the patient's chart and labs.  Questions were answered to the patient's satisfaction.     [place] Lohmeyer,LES

## 2015-04-23 NOTE — Anesthesia Procedure Notes (Signed)
Procedure Name: Intubation Date/Time: 04/23/2015 1:29 PM Performed by: Dione Booze Pre-anesthesia Checklist: Emergency Drugs available, Suction available, Patient being monitored and Patient identified Patient Re-evaluated:Patient Re-evaluated prior to inductionOxygen Delivery Method: Circle system utilized Preoxygenation: Pre-oxygenation with 100% oxygen Intubation Type: IV induction Ventilation: Mask ventilation without difficulty Grade View: Grade II Tube type: Oral Tube size: 7.5 mm Number of attempts: 1 Airway Equipment and Method: Stylet Placement Confirmation: ETT inserted through vocal cords under direct vision and positive ETCO2 Secured at: 21 cm Tube secured with: Tape Dental Injury: Teeth and Oropharynx as per pre-operative assessment

## 2015-04-23 NOTE — Progress Notes (Addendum)
Pt vomited 3x, clear emesis, very small amounts. Zofran given and provided some relief along with rest. Will continue to monitor pt closely.   Pt walked again at 0515, about 200 ft, tolerated well. No more episodes of nausea/vomiting. Pt states he feels a lot better.   Janell Quiet, RN

## 2015-04-23 NOTE — Op Note (Signed)
Preoperative diagnosis: Clinically localized adenocarcinoma of the prostate (clinical stage T3b N0 M0)  Postoperative diagnosis: Clinically localized adenocarcinoma of the prostate (clinical stage T3b N0 M0)  Procedure:  1. Robotic assisted laparoscopic radical prostatectomy (Non nerve sparing) 2. Bilateral robotic assisted laparoscopic extended pelvic lymphadenectomy  Surgeon: Pryor Curia. M.D.  Assistant(s): Debbrah Alar, PA-C  Resident: Dr. Aris Lot  Anesthesia: General  Complications: None  EBL: 100 mL  IVF:  2000 mL crystalloid  Specimens: 1. Prostate and seminal vesicles 2. Right pelvic lymph nodes 3. Left pelvic lymph nodes  Disposition of specimens: Pathology  Drains: 1. 20 Fr coude catheter 2. # 19 Blake pelvic drain  Indication: Dan Brandt is a 60 y.o. patient with clinically localized prostate cancer.  After a thorough review of the management options for treatment of prostate cancer, he elected to proceed with surgical therapy and the above procedure(s).  We have discussed the potential benefits and risks of the procedure, side effects of the proposed treatment, the likelihood of the patient achieving the goals of the procedure, and any potential problems that might occur during the procedure or recuperation. Informed consent has been obtained.  Description of procedure:  The patient was taken to the operating room and a general anesthetic was administered. He was given preoperative antibiotics, placed in the dorsal lithotomy position, and prepped and draped in the usual sterile fashion. Next a preoperative timeout was performed. A urethral catheter was placed into the bladder and a site was selected near the umbilicus for placement of the camera port. This was placed using a standard open Hassan technique which allowed entry into the peritoneal cavity under direct vision and without difficulty. A 12 mm port was placed and a pneumoperitoneum  established. The camera was then used to inspect the abdomen and there was no evidence of any intra-abdominal injuries or other abnormalities. The remaining abdominal ports were then placed. 8 mm robotic ports were placed in the right lower quadrant, left lower quadrant, and far left lateral abdominal wall. A 5 mm port was placed in the right upper quadrant and a 12 mm port was placed in the right lateral abdominal wall for laparoscopic assistance. All ports were placed under direct vision without difficulty. The surgical cart was then docked.   Utilizing the cautery scissors, the bladder was reflected posteriorly allowing entry into the space of Retzius and identification of the endopelvic fascia and prostate. The periprostatic fat was then removed from the prostate allowing full exposure of the endopelvic fascia. The endopelvic fascia was then incised from the apex back to the base of the prostate bilaterally and the underlying levator muscle fibers were swept laterally off the prostate thereby isolating the dorsal venous complex. The dorsal vein was then stapled and divided with a 45 mm Flex Echelon stapler. Attention then turned to the bladder neck which was divided anteriorly thereby allowing entry into the bladder and exposure of the urethral catheter. The catheter balloon was deflated and the catheter was brought into the operative field and used to retract the prostate anteriorly. The posterior bladder neck was then examined and was divided allowing further dissection between the bladder and prostate posteriorly until the vasa deferentia and seminal vessels were identified. The vasa deferentia were isolated, divided, and lifted anteriorly. The seminal vesicles were dissected down to their tips with care to control the seminal vascular arterial blood supply. These structures were then lifted anteriorly and the space between Denonvillier's fascia and the anterior rectum was  developed with a combination of  sharp and blunt dissection. This isolated the vascular pedicles of the prostate.  A wide non nerve sparing dissection was performed with Weck clips used to ligate the vascular pedicles of the prostate bilaterally. The vascular pedicles of the prostate were then divided.  The urethra was then sharply transected allowing the prostate specimen to be disarticulated. The pelvis was copiously irrigated and hemostasis was ensured. There was no evidence for rectal injury.  Attention then turned to the right pelvic sidewall. The fibrofatty tissue extending from the genitofemoral nerve laterally to the confluence of the iliac vessels proximally to the hypogastric artery posteriorly and Cooper's ligament distally was dissected free from the pelvic sidewall with care to preserve the obturator nerve and major vascular structures. Weck clips were used for lymphostasis and hemostasis. An identical procedure was then performed on the contralateral side and the lymphatic packets were removed for permanent pathologic analysis.  Attention then turned to the urethral anastomosis. A 2-0 Vicryl slip knot was placed between Denonvillier's fascia, the posterior bladder neck, and the posterior urethra to reapproximate these structures. A double-armed 3-0 Monocryl suture was then used to perform a 360 running tension-free anastomosis between the bladder neck and urethra. A new urethral catheter was then placed into the bladder and irrigated. There were no blood clots within the bladder and the anastomosis appeared to be watertight. A #19 Blake drain was then brought through the left lateral 8 mm port site and positioned appropriately within the pelvis. It was secured to the skin with a nylon suture. The surgical cart was then undocked. The right lateral 12 mm port site was closed at the fascial level with a 0 Vicryl suture placed laparoscopically. All remaining ports were then removed under direct vision. The prostate specimen was  removed intact within the Endopouch retrieval bag via the periumbilical camera port site. This fascial opening was closed with two running 0 Vicryl sutures. 0.25% Marcaine was then injected into all port sites and all incisions were reapproximated at the skin level with staples. Sterile dressings were applied. The patient appeared to tolerate the procedure well and without complications. The patient was able to be extubated and transferred to the recovery unit in satisfactory condition.  Pryor Curia MD

## 2015-04-23 NOTE — Progress Notes (Signed)
Patient ID: XENG SCERBO, male   DOB: 09-Oct-1955, 60 y.o.   MRN: [id number]  Post-op note  Subjective: The patient is doing well.  No complaints.  Objective: Vital signs in last 24 hours: Temp:  [97.3 F (36.3 C)-98.1 F (36.7 C)] 97.5 F (36.4 C) (01/12 1927) Pulse Rate:  [67-107] 68 (01/12 1927) Resp:  [12-18] 16 (01/12 1927) BP: (150-178)/(86-95) 178/92 mmHg (01/12 1927) SpO2:  [98 %-100 %] 100 % (01/12 1927) Weight:  [93.781 kg (206 lb 12 oz)] 93.781 kg (206 lb 12 oz) (01/12 1927)  Intake/Output from previous day:   Intake/Output this shift: Total I/O In: -  Out: 200 [Urine:200]  Physical Exam:  General: Alert and oriented. Abdomen: Soft, Nondistended. Incisions: Clean and dry. GU: Urine clear  Lab Results:  Recent Labs  04/23/15 1827  HGB 13.0  HCT 38.4*    Assessment/Plan: POD#0   1) Ambulate twice tonight, incentive spirometry   Pryor Curia. MD   LOS: 0 days   Mishell Donalson,LES 04/23/2015, 7:49 PM

## 2015-04-23 NOTE — Discharge Instructions (Signed)

## 2015-04-24 ENCOUNTER — Encounter (HOSPITAL_COMMUNITY): Payer: Self-pay | Admitting: Urology

## 2015-04-24 LAB — HEMOGLOBIN AND HEMATOCRIT, BLOOD
HCT: 36.3 % — ABNORMAL LOW (ref 39.0–52.0)
HEMOGLOBIN: 12.3 g/dL — AB (ref 13.0–17.0)

## 2015-04-24 MED ORDER — BISACODYL 10 MG RE SUPP
10.0000 mg | Freq: Once | RECTAL | Status: DC
  Filled 2015-04-24: qty 1

## 2015-04-24 MED ORDER — HYDROCODONE-ACETAMINOPHEN 5-325 MG PO TABS
1.0000 | ORAL_TABLET | Freq: Four times a day (QID) | ORAL | Status: DC | PRN
  Administered 2015-04-24: 1 via ORAL
  Filled 2015-04-24: qty 1

## 2015-04-24 MED ORDER — OXYCODONE-ACETAMINOPHEN 5-325 MG PO TABS: 1.0000 | ORAL_TABLET | ORAL | Status: DC | PRN

## 2015-04-24 NOTE — Care Management Note (Signed)
Case Management Note  Patient Details  Name: Dan Brandt MRN: [id number] Date of Birth: 01-15-1956  Subjective/Objective: 60 y/o m admitted w/Prostate Ca. S/p rob lap prostatectomy. From home.                   Action/Plan:d/c home.   Expected Discharge Date:                  Expected Discharge Plan:  Home/Self Care  In-House Referral:     Discharge planning Services  CM Consult  Post Acute Care Choice:    Choice offered to:     DME Arranged:    DME Agency:     HH Arranged:    [place] Agency:     Status of Service:  Completed, signed off  Medicare Important Message Given:    Date Medicare IM Given:    Medicare IM give by:    Date Additional Medicare IM Given:    Additional Medicare Important Message give by:     If discussed at [place] of Stay Meetings, dates discussed:    Additional Comments:  Dessa Phi, RN 04/24/2015, 12:16 PM

## 2015-04-24 NOTE — Progress Notes (Signed)
Discussed with patient discharge instructions, he verbalized agreement and understanding.  Patient's IV was discontinued with no complications.  Patient to go down in wheelchair with all belongings to go home in private vehicle.

## 2015-04-24 NOTE — Progress Notes (Signed)
1 Day Post-Op Subjective: The patient is doing well.  Had episodes of emesis x 3 o//n with small amounts of clear fluid. Relieved with zofran. Ambulated ~200 feet. No additional episodes since MN. Hb stable. UOP excellent. JP output 130 total, 65 since MN. Pain well controlled without need for morphine. Overall states he feels well.  Objective: Vital signs in last 24 hours: Temp:  [97.3 F (36.3 C)-98.6 F (37 C)] 98.2 F (36.8 C) (01/13 0521) Pulse Rate:  [67-107] 75 (01/13 0521) Resp:  [12-18] 16 (01/13 0521) BP: (141-178)/(62-95) 144/73 mmHg (01/13 0521) SpO2:  [98 %-100 %] 100 % (01/13 0521) Weight:  [93.781 kg (206 lb 12 oz)] 93.781 kg (206 lb 12 oz) (01/12 1927)  Intake/Output from previous day: 01/12 0701 - 01/13 0700 In: 3500 [I.V.:2500; IV Piggyback:1000] Out: [id number] [Urine:1375; Drains:130; Blood:100] Intake/Output this shift: Total I/O In: -  Out: 1505 [id number]; Drains:130]  Physical Exam:  General: Alert and oriented. CV: RRR Lungs: Clear bilaterally. GI: Soft, Nondistended. Incisions: Clean, dry, and intact Urine: Clear Extremities: Nontender, no erythema, no edema.  Lab Results:  Recent Labs  04/23/15 1827 04/24/15 0448  HGB 13.0 12.3*  HCT 38.4* 36.3*      Assessment/Plan: POD# 1 s/p robotic prostatectomy with BPLND  1) SL IVF 2) Ambulate, Incentive spirometry 3) Transition to oral pain medication 4) Dulcolax suppository 5) D/C pelvic drain 6) Plan for likely discharge later today    LOS: 1 day   Star Age 04/24/2015, 6:42 AM

## 2015-04-24 NOTE — Discharge Summary (Signed)
Date of admission: 04/23/2015  Date of discharge: 04/24/2015  Admission diagnosis: Prostate Cancer  Discharge diagnosis: Prostate Cancer  History and Physical: For full details, please see admission history and physical. Briefly, Dan Brandt is a 60 y.o. gentleman with localized prostate cancer.  After discussing management/treatment options, he elected to proceed with surgical treatment.  Hospital Course: Dan Brandt was taken to the operating room on 04/23/2015 and underwent a robotic assisted laparoscopic radical prostatectomy. He tolerated this procedure well and without complications. Postoperatively, he was able to be transferred to a regular hospital room following recovery from anesthesia.  He was able to begin ambulating the night of surgery. He remained hemodynamically stable overnight.  He had excellent urine output with appropriately minimal output from his pelvic drain and his pelvic drain was removed on POD #1.  He was transitioned to oral pain medication, tolerated a clear liquid diet, and had met all discharge criteria and was able to be discharged home later on POD#1. A small area of separation was noted in the middle of midline wound (diameter of a pencil) which was re-approximated with steri-strips.  Laboratory values:  Recent Labs  04/23/15 1827 04/24/15 0448  HGB 13.0 12.3*  HCT 38.4* 36.3*    Physical Exam:  Vital signs in last 24 hours: Temp:  [97.3 F (36.3 C)-98.6 F (37 C)] 98.2 F (36.8 C) (01/13 0521) Pulse Rate:  [68-107] 75 (01/13 0521) Resp:  [12-18] 16 (01/13 0521) BP: (141-178)/(62-95) 144/73 mmHg (01/13 0521) SpO2:  [98 %-100 %] 100 % (01/13 0521) Weight:  [93.781 kg (206 lb 12 oz)] 93.781 kg (206 lb 12 oz) (01/12 1927) Constitutional:  Alert and oriented, No acute distress Cardiovascular: Regular rate and rhythm, No JVD Respiratory: Normal respiratory effort, SORA GI: Abdomen is soft, nontender, nondistended, no abdominal masses, small  amount of wound separation in the middle of midline wound, re-approximated with steri-strips  Genitourinary: No CVAT. Foley draining clear, yellow uinre Rectal: deferred Neurologic: Grossly intact, no focal deficits Psychiatric: Normal mood and affect  Disposition: Home  Discharge instruction: He was instructed to be ambulatory but to refrain from heavy lifting, strenuous activity, or driving. He was instructed on urethral catheter care.  Discharge medications:     Medication List    STOP taking these medications        tamsulosin 0.4 MG Caps capsule  Commonly known as:  FLOMAX      TAKE these medications        HYDROcodone-acetaminophen 5-325 MG tablet  Commonly known as:  NORCO  Take 1-2 tablets by mouth every 6 (six) hours as needed.     lisinopril 20 MG tablet  Commonly known as:  PRINIVIL,ZESTRIL  Take 20 mg by mouth daily.     loratadine 10 MG tablet  Commonly known as:  CLARITIN  Take 10 mg by mouth daily.     lovastatin 20 MG tablet  Commonly known as:  MEVACOR  Take 20 mg by mouth daily.     omeprazole 20 MG capsule  Commonly known as:  PRILOSEC  Take 20 mg by mouth daily.     sulfamethoxazole-trimethoprim 800-160 MG tablet  Commonly known as:  BACTRIM DS,SEPTRA DS  Take 1 tablet by mouth 2 (two) times daily. Start the day prior to foley removal appointment        Followup: He will follow-up as instructed for foley catheter removal

## 2015-07-17 DIAGNOSIS — N3946 Mixed incontinence: Secondary | ICD-10-CM | POA: Diagnosis not present

## 2015-07-17 DIAGNOSIS — M62838 Other muscle spasm: Secondary | ICD-10-CM | POA: Diagnosis not present

## 2015-07-17 DIAGNOSIS — M6281 Muscle weakness (generalized): Secondary | ICD-10-CM | POA: Diagnosis not present

## 2015-07-17 DIAGNOSIS — R278 Other lack of coordination: Secondary | ICD-10-CM | POA: Diagnosis not present

## 2015-08-04 DIAGNOSIS — R278 Other lack of coordination: Secondary | ICD-10-CM | POA: Diagnosis not present

## 2015-08-04 DIAGNOSIS — M6281 Muscle weakness (generalized): Secondary | ICD-10-CM | POA: Diagnosis not present

## 2015-08-04 DIAGNOSIS — N3946 Mixed incontinence: Secondary | ICD-10-CM | POA: Diagnosis not present

## 2015-08-04 DIAGNOSIS — M62838 Other muscle spasm: Secondary | ICD-10-CM | POA: Diagnosis not present

## 2015-08-18 DIAGNOSIS — M6281 Muscle weakness (generalized): Secondary | ICD-10-CM | POA: Diagnosis not present

## 2015-08-18 DIAGNOSIS — M62838 Other muscle spasm: Secondary | ICD-10-CM | POA: Diagnosis not present

## 2015-08-18 DIAGNOSIS — R278 Other lack of coordination: Secondary | ICD-10-CM | POA: Diagnosis not present

## 2015-08-18 DIAGNOSIS — M6200 Separation of muscle (nontraumatic), unspecified site: Secondary | ICD-10-CM | POA: Diagnosis not present

## 2015-09-01 DIAGNOSIS — M6281 Muscle weakness (generalized): Secondary | ICD-10-CM | POA: Diagnosis not present

## 2015-09-01 DIAGNOSIS — C61 Malignant neoplasm of prostate: Secondary | ICD-10-CM | POA: Diagnosis not present

## 2015-09-01 DIAGNOSIS — M6200 Separation of muscle (nontraumatic), unspecified site: Secondary | ICD-10-CM | POA: Diagnosis not present

## 2015-09-01 DIAGNOSIS — R278 Other lack of coordination: Secondary | ICD-10-CM | POA: Diagnosis not present

## 2015-09-01 DIAGNOSIS — M62838 Other muscle spasm: Secondary | ICD-10-CM | POA: Diagnosis not present

## 2015-09-01 DIAGNOSIS — R5383 Other fatigue: Secondary | ICD-10-CM | POA: Diagnosis not present

## 2015-09-01 DIAGNOSIS — R1909 Other intra-abdominal and pelvic swelling, mass and lump: Secondary | ICD-10-CM | POA: Diagnosis not present

## 2015-09-01 DIAGNOSIS — Z0000 Encounter for general adult medical examination without abnormal findings: Secondary | ICD-10-CM | POA: Diagnosis not present

## 2015-09-02 DIAGNOSIS — R1909 Other intra-abdominal and pelvic swelling, mass and lump: Secondary | ICD-10-CM | POA: Diagnosis not present

## 2015-09-02 DIAGNOSIS — N5201 Erectile dysfunction due to arterial insufficiency: Secondary | ICD-10-CM | POA: Diagnosis not present

## 2015-09-02 DIAGNOSIS — N393 Stress incontinence (female) (male): Secondary | ICD-10-CM | POA: Diagnosis not present

## 2015-09-02 DIAGNOSIS — C61 Malignant neoplasm of prostate: Secondary | ICD-10-CM | POA: Diagnosis not present

## 2015-09-15 DIAGNOSIS — N393 Stress incontinence (female) (male): Secondary | ICD-10-CM | POA: Diagnosis not present

## 2015-09-15 DIAGNOSIS — M6281 Muscle weakness (generalized): Secondary | ICD-10-CM | POA: Diagnosis not present

## 2015-09-15 DIAGNOSIS — R278 Other lack of coordination: Secondary | ICD-10-CM | POA: Diagnosis not present

## 2015-10-09 DIAGNOSIS — C61 Malignant neoplasm of prostate: Secondary | ICD-10-CM | POA: Diagnosis not present

## 2015-10-16 DIAGNOSIS — M62838 Other muscle spasm: Secondary | ICD-10-CM | POA: Diagnosis not present

## 2015-10-16 DIAGNOSIS — N3946 Mixed incontinence: Secondary | ICD-10-CM | POA: Diagnosis not present

## 2015-10-16 DIAGNOSIS — R278 Other lack of coordination: Secondary | ICD-10-CM | POA: Diagnosis not present

## 2015-10-16 DIAGNOSIS — N5231 Erectile dysfunction following radical prostatectomy: Secondary | ICD-10-CM | POA: Diagnosis not present

## 2015-10-16 DIAGNOSIS — M6281 Muscle weakness (generalized): Secondary | ICD-10-CM | POA: Diagnosis not present

## 2015-10-16 DIAGNOSIS — C61 Malignant neoplasm of prostate: Secondary | ICD-10-CM | POA: Diagnosis not present

## 2015-10-27 ENCOUNTER — Telehealth: Payer: Self-pay | Admitting: *Deleted

## 2015-10-27 DIAGNOSIS — Z9079 Acquired absence of other genital organ(s): Secondary | ICD-10-CM | POA: Diagnosis not present

## 2015-10-27 DIAGNOSIS — R9721 Rising PSA following treatment for malignant neoplasm of prostate: Secondary | ICD-10-CM | POA: Diagnosis not present

## 2015-10-27 NOTE — Telephone Encounter (Signed)
CALLED PATIENT TO INFORM OF APPT. ON 10-30-15 - 10:30 AM FOR THE NURSE AND 11 AM FOR THE SIM, MR. Dundon WAS IN THE WAREHOUSE AND I WILL CALL BACK LATER TO-  DAY WITH THE APPT. INFO.

## 2015-10-30 ENCOUNTER — Ambulatory Visit
Admission: RE | Admit: 2015-10-30 | Discharge: 2015-10-30 | Disposition: A | Discharge status: Home / Self Care | Payer: BLUE CROSS/BLUE SHIELD | Source: Ambulatory Visit | Attending: Radiation Oncology | Admitting: Radiation Oncology

## 2015-10-30 DIAGNOSIS — C61 Malignant neoplasm of prostate: Secondary | ICD-10-CM | POA: Insufficient documentation

## 2015-10-30 DIAGNOSIS — Z510 Encounter for antineoplastic radiation therapy: Secondary | ICD-10-CM | POA: Diagnosis not present

## 2015-10-30 NOTE — Progress Notes (Signed)
Denies having a pacemaker. Denies hx of radiation therapy.  

## 2015-10-30 NOTE — Progress Notes (Signed)
  Radiation Oncology         [phone] ________________________________  Name: Dan Brandt MRN: [id number]  Date: 10/30/2015  DOB: [birth year]  SIMULATION AND TREATMENT PLANNING NOTE    ICD-9-CM ICD-10-CM   1. Prostate cancer ([place]) 4 C61     DIAGNOSIS:  60 yo man with detectable PSA s/p prostatectomy for pT3b, pN0 adenocarcinoma with Gleason 4+4  NARRATIVE:  The patient was brought to the [facility].  Identity was confirmed.  All relevant records and images related to the planned course of therapy were reviewed.  The patient freely provided informed written consent to proceed with treatment after reviewing the details related to the planned course of therapy. The consent form was witnessed and verified by the simulation staff.  Then, the patient was set-up in a stable reproducible supine position for radiation therapy.  A vacuum lock pillow device was custom fabricated to position his legs in a reproducible immobilized position.  Then, I performed a urethrogram under sterile conditions to identify the prostatic apex.  CT images were obtained.  Surface markings were placed.  The CT images were loaded into the planning software.  Then the prostate target and avoidance structures including the rectum, bladder, bowel and hips were contoured.  Treatment planning then occurred.  The radiation prescription was entered and confirmed.  A total of one complex treatment device was fabricated. I have requested : Intensity Modulated Radiotherapy (IMRT) is medically necessary for this case for the following reason:  Rectal sparing.Marland Kitchen  PLAN:  The patient will receive 68.4 Gy in 38 fractions.  ________________________________  Sheral Apley Tammi Klippel, M.D.  This document serves as a record of services personally performed by [name], MD. It was created on his behalf by Truddie Hidden, a trained medical scribe. The creation of this record is based on the scribe's personal  observations and the provider's statements to them. This document has been checked and approved by the attending provider.

## 2015-11-04 DIAGNOSIS — Z0001 Encounter for general adult medical examination with abnormal findings: Secondary | ICD-10-CM | POA: Diagnosis not present

## 2015-11-05 DIAGNOSIS — C61 Malignant neoplasm of prostate: Secondary | ICD-10-CM | POA: Diagnosis not present

## 2015-11-05 DIAGNOSIS — Z510 Encounter for antineoplastic radiation therapy: Secondary | ICD-10-CM | POA: Diagnosis not present

## 2015-11-10 ENCOUNTER — Ambulatory Visit
Admission: RE | Admit: 2015-11-10 | Discharge: 2015-11-10 | Disposition: A | Discharge status: Home / Self Care | Payer: BLUE CROSS/BLUE SHIELD | Source: Ambulatory Visit | Attending: Radiation Oncology | Admitting: Radiation Oncology

## 2015-11-10 DIAGNOSIS — Z510 Encounter for antineoplastic radiation therapy: Secondary | ICD-10-CM | POA: Diagnosis not present

## 2015-11-10 DIAGNOSIS — C61 Malignant neoplasm of prostate: Secondary | ICD-10-CM | POA: Diagnosis not present

## 2015-11-11 ENCOUNTER — Ambulatory Visit
Admission: RE | Admit: 2015-11-11 | Discharge: 2015-11-11 | Disposition: A | Discharge status: Home / Self Care | Payer: BLUE CROSS/BLUE SHIELD | Source: Ambulatory Visit | Attending: Radiation Oncology | Admitting: Radiation Oncology

## 2015-11-11 DIAGNOSIS — C61 Malignant neoplasm of prostate: Secondary | ICD-10-CM | POA: Diagnosis not present

## 2015-11-11 DIAGNOSIS — Z1212 Encounter for screening for malignant neoplasm of rectum: Secondary | ICD-10-CM | POA: Diagnosis not present

## 2015-11-11 DIAGNOSIS — Z510 Encounter for antineoplastic radiation therapy: Secondary | ICD-10-CM | POA: Diagnosis not present

## 2015-11-11 DIAGNOSIS — Z0001 Encounter for general adult medical examination with abnormal findings: Secondary | ICD-10-CM | POA: Diagnosis not present

## 2015-11-11 DIAGNOSIS — Z6828 Body mass index (BMI) 28.0-28.9, adult: Secondary | ICD-10-CM | POA: Diagnosis not present

## 2015-11-12 ENCOUNTER — Ambulatory Visit
Admission: RE | Admit: 2015-11-12 | Discharge: 2015-11-12 | Disposition: A | Discharge status: Home / Self Care | Payer: BLUE CROSS/BLUE SHIELD | Source: Ambulatory Visit | Attending: Radiation Oncology | Admitting: Radiation Oncology

## 2015-11-12 DIAGNOSIS — Z510 Encounter for antineoplastic radiation therapy: Secondary | ICD-10-CM | POA: Diagnosis not present

## 2015-11-12 DIAGNOSIS — C61 Malignant neoplasm of prostate: Secondary | ICD-10-CM | POA: Diagnosis not present

## 2015-11-13 ENCOUNTER — Ambulatory Visit
Admission: RE | Admit: 2015-11-13 | Discharge: 2015-11-13 | Disposition: A | Discharge status: Home / Self Care | Payer: BLUE CROSS/BLUE SHIELD | Source: Ambulatory Visit | Attending: Radiation Oncology | Admitting: Radiation Oncology

## 2015-11-13 VITALS — BP 139/58 | HR 60 | Resp 16 | Wt 200.7 lb

## 2015-11-13 DIAGNOSIS — C61 Malignant neoplasm of prostate: Secondary | ICD-10-CM | POA: Diagnosis not present

## 2015-11-13 DIAGNOSIS — Z510 Encounter for antineoplastic radiation therapy: Secondary | ICD-10-CM | POA: Diagnosis not present

## 2015-11-13 NOTE — Progress Notes (Signed)
  Radiation Oncology         [phone]   Name: Dan Brandt MRN: [id number]   Date: 11/13/2015  DOB: [birth year]     Weekly Radiation Therapy Management    ICD-9-CM ICD-10-CM   1. Prostate cancer ([place]) 185 C61     Current Dose: 7.2 Gy  Planned Dose:  68.4 Gy  Narrative The patient presents for routine under treatment assessment.  Weight and vitals stable. Denies pain. Reports nocturia x 3. Denies dysuria or hematuria. Reports leakage and incontinence have resolved. Describes urine stream as steady. Denies difficulty emptying his bladder. Denies diarrhea. Denies fatigue.  The patient is without complaint. Set-up films were reviewed. The chart was checked.  Physical Findings  weight is 200 lb 11.2 oz (91 kg). His blood pressure is 139/58 (abnormal) and his pulse is 60. His respiration is 16 and oxygen saturation is 100%. . Weight essentially stable.  No significant changes.  Impression The patient is tolerating radiation.  Plan Continue treatment as planned.         Sheral Apley Tammi Klippel, M.D.    This document serves as a record of services personally performed by [name], MD. It was created on his behalf by Lendon Collar, a trained medical scribe. The creation of this record is based on the scribe's personal observations and the provider's statements to them. This document has been checked and approved by the attending provider.

## 2015-11-13 NOTE — Progress Notes (Signed)
Weight and vitals stable. Denies pain. Reports nocturia x 3. Denies dysuria or hematuria. Reports leakage and incontinence have resolved. Describes urine stream as steady. Denies difficulty emptying his bladder. Denies diarrhea. Denies fatigue.   BP (!) 139/58   Pulse 60   Resp 16   Wt 200 lb 11.2 oz (91 kg)   SpO2 100%   BMI 29.64 kg/m  Wt Readings from Last 3 Encounters:  11/13/15 200 lb 11.2 oz (91 kg)  10/30/15 199 lb 12.8 oz (90.6 kg)  04/23/15 206 lb 12 oz (93.8 kg)

## 2015-11-16 ENCOUNTER — Ambulatory Visit
Admission: RE | Admit: 2015-11-16 | Discharge: 2015-11-16 | Disposition: A | Discharge status: Home / Self Care | Payer: BLUE CROSS/BLUE SHIELD | Source: Ambulatory Visit | Attending: Radiation Oncology | Admitting: Radiation Oncology

## 2015-11-16 DIAGNOSIS — Z510 Encounter for antineoplastic radiation therapy: Secondary | ICD-10-CM | POA: Diagnosis not present

## 2015-11-16 DIAGNOSIS — C61 Malignant neoplasm of prostate: Secondary | ICD-10-CM | POA: Diagnosis not present

## 2015-11-17 ENCOUNTER — Ambulatory Visit
Admission: RE | Admit: 2015-11-17 | Discharge: 2015-11-17 | Disposition: A | Discharge status: Home / Self Care | Payer: BLUE CROSS/BLUE SHIELD | Source: Ambulatory Visit | Attending: Radiation Oncology | Admitting: Radiation Oncology

## 2015-11-17 DIAGNOSIS — C61 Malignant neoplasm of prostate: Secondary | ICD-10-CM | POA: Diagnosis not present

## 2015-11-17 DIAGNOSIS — Z510 Encounter for antineoplastic radiation therapy: Secondary | ICD-10-CM | POA: Diagnosis not present

## 2015-11-18 ENCOUNTER — Ambulatory Visit
Admission: RE | Admit: 2015-11-18 | Discharge: 2015-11-18 | Disposition: A | Discharge status: Home / Self Care | Payer: BLUE CROSS/BLUE SHIELD | Source: Ambulatory Visit | Attending: Radiation Oncology | Admitting: Radiation Oncology

## 2015-11-18 VITALS — BP 126/50 | HR 63 | Resp 16 | Wt 199.3 lb

## 2015-11-18 DIAGNOSIS — C61 Malignant neoplasm of prostate: Secondary | ICD-10-CM | POA: Diagnosis not present

## 2015-11-18 DIAGNOSIS — Z510 Encounter for antineoplastic radiation therapy: Secondary | ICD-10-CM | POA: Diagnosis not present

## 2015-11-18 NOTE — Progress Notes (Signed)
  Radiation Oncology         [phone] ________________________________  Name: Dan Brandt MRN: [id number]  Date: 11/18/2015  DOB: 1955-04-30  Weekly Radiation Therapy Management    ICD-9-CM ICD-10-CM   1. Prostate cancer ([place]) 185 C61      Current Dose: 12.6 Gy     Planned Dose:  68.4 Gy  Narrative . . . . . . . . The patient presents for routine under treatment assessment.                                   The patient is without complaint. He has nocturia 3.                                 Set-up films were reviewed.                                 The chart was checked. Physical Findings. . .  weight is 199 lb 4.8 oz (90.4 kg). His blood pressure is 126/50 (abnormal) and his pulse is 63. His respiration is 16 and oxygen saturation is 100%. . The lungs are clear. The heart has a regular rhythm and rate. The abdomen is soft and nontender with normal bowel sounds. Impression . . . . . . . The patient is tolerating radiation. Plan . . . . . . . . . . . . Continue treatment as planned.  ________________________________   Blair Promise, PhD, MD

## 2015-11-18 NOTE — Addendum Note (Signed)
Encounter addended by: Heywood Footman, RN on: 11/18/2015  2:57 PM<BR>    Actions taken: Chief Complaint modified, Patient Education assessment filed

## 2015-11-18 NOTE — Progress Notes (Signed)
Weight and vitals stable. Denies pain. Reports nocturia x 3. Denies dysuria or hematuria. Reports leakage and incontinence have resolved. Describes urine stream as steady. Denies difficulty emptying his bladder. Denies diarrhea. Denies fatigue.   BP (!) 126/50   Pulse 63   Resp 16   Wt 199 lb 4.8 oz (90.4 kg)   SpO2 100%   BMI 29.43 kg/m  Wt Readings from Last 3 Encounters:  11/18/15 199 lb 4.8 oz (90.4 kg)  11/13/15 200 lb 11.2 oz (91 kg)  10/30/15 199 lb 12.8 oz (90.6 kg)

## 2015-11-19 ENCOUNTER — Ambulatory Visit
Admission: RE | Admit: 2015-11-19 | Discharge: 2015-11-19 | Disposition: A | Discharge status: Home / Self Care | Payer: BLUE CROSS/BLUE SHIELD | Source: Ambulatory Visit | Attending: Radiation Oncology | Admitting: Radiation Oncology

## 2015-11-19 DIAGNOSIS — C61 Malignant neoplasm of prostate: Secondary | ICD-10-CM | POA: Diagnosis not present

## 2015-11-19 DIAGNOSIS — Z510 Encounter for antineoplastic radiation therapy: Secondary | ICD-10-CM | POA: Diagnosis not present

## 2015-11-20 ENCOUNTER — Ambulatory Visit
Admission: RE | Admit: 2015-11-20 | Discharge: 2015-11-20 | Disposition: A | Discharge status: Home / Self Care | Payer: BLUE CROSS/BLUE SHIELD | Source: Ambulatory Visit | Attending: Radiation Oncology | Admitting: Radiation Oncology

## 2015-11-20 DIAGNOSIS — Z510 Encounter for antineoplastic radiation therapy: Secondary | ICD-10-CM | POA: Diagnosis not present

## 2015-11-20 DIAGNOSIS — C61 Malignant neoplasm of prostate: Secondary | ICD-10-CM | POA: Diagnosis not present

## 2015-11-23 ENCOUNTER — Ambulatory Visit
Admission: RE | Admit: 2015-11-23 | Discharge: 2015-11-23 | Disposition: A | Discharge status: Home / Self Care | Payer: BLUE CROSS/BLUE SHIELD | Source: Ambulatory Visit | Attending: Radiation Oncology | Admitting: Radiation Oncology

## 2015-11-23 ENCOUNTER — Encounter: Payer: Self-pay | Admitting: Medical Oncology

## 2015-11-23 DIAGNOSIS — Z510 Encounter for antineoplastic radiation therapy: Secondary | ICD-10-CM | POA: Diagnosis not present

## 2015-11-23 DIAGNOSIS — C61 Malignant neoplasm of prostate: Secondary | ICD-10-CM | POA: Diagnosis not present

## 2015-11-24 ENCOUNTER — Ambulatory Visit
Admission: RE | Admit: 2015-11-24 | Discharge: 2015-11-24 | Disposition: A | Discharge status: Home / Self Care | Payer: BLUE CROSS/BLUE SHIELD | Source: Ambulatory Visit | Attending: Radiation Oncology | Admitting: Radiation Oncology

## 2015-11-24 DIAGNOSIS — Z510 Encounter for antineoplastic radiation therapy: Secondary | ICD-10-CM | POA: Diagnosis not present

## 2015-11-24 DIAGNOSIS — C61 Malignant neoplasm of prostate: Secondary | ICD-10-CM | POA: Diagnosis not present

## 2015-11-25 ENCOUNTER — Ambulatory Visit
Admission: RE | Admit: 2015-11-25 | Discharge: 2015-11-25 | Disposition: A | Discharge status: Home / Self Care | Payer: BLUE CROSS/BLUE SHIELD | Source: Ambulatory Visit | Attending: Radiation Oncology | Admitting: Radiation Oncology

## 2015-11-25 DIAGNOSIS — C61 Malignant neoplasm of prostate: Secondary | ICD-10-CM | POA: Diagnosis not present

## 2015-11-25 DIAGNOSIS — Z510 Encounter for antineoplastic radiation therapy: Secondary | ICD-10-CM | POA: Diagnosis not present

## 2015-11-26 ENCOUNTER — Ambulatory Visit
Admission: RE | Admit: 2015-11-26 | Discharge: 2015-11-26 | Disposition: A | Discharge status: Home / Self Care | Payer: BLUE CROSS/BLUE SHIELD | Source: Ambulatory Visit | Attending: Radiation Oncology | Admitting: Radiation Oncology

## 2015-11-26 DIAGNOSIS — Z510 Encounter for antineoplastic radiation therapy: Secondary | ICD-10-CM | POA: Diagnosis not present

## 2015-11-26 DIAGNOSIS — C61 Malignant neoplasm of prostate: Secondary | ICD-10-CM | POA: Diagnosis not present

## 2015-11-27 ENCOUNTER — Ambulatory Visit
Admission: RE | Admit: 2015-11-27 | Discharge: 2015-11-27 | Disposition: A | Discharge status: Home / Self Care | Payer: BLUE CROSS/BLUE SHIELD | Source: Ambulatory Visit | Attending: Radiation Oncology | Admitting: Radiation Oncology

## 2015-11-27 VITALS — BP 114/59 | HR 65 | Resp 16 | Wt 197.0 lb

## 2015-11-27 DIAGNOSIS — C61 Malignant neoplasm of prostate: Secondary | ICD-10-CM | POA: Diagnosis not present

## 2015-11-27 DIAGNOSIS — Z510 Encounter for antineoplastic radiation therapy: Secondary | ICD-10-CM | POA: Diagnosis not present

## 2015-11-27 NOTE — Progress Notes (Signed)
  Radiation Oncology         [phone]   Name: Dan Brandt MRN: [id number]   Date: 11/27/2015  DOB: [birth year]     Weekly Radiation Therapy Management    ICD-9-CM ICD-10-CM   1. Prostate cancer ([place]) 185 C61     Current Dose: 25.2 Gy  Planned Dose:  68.4 Gy  Narrative The patient presents for routine under treatment assessment.  Weight and vitals stable. Denies pain. Reports nocturia x 3. Denies dysuria or hematuria. Reports leakage and incontinence have resolved. Describes urine stream as steady. Denies difficulty emptying his bladder. Reports new onset of diarrhea maybe 1-2 episodes per day and fatigue.  Set-up films were reviewed. The chart was checked.  Physical Findings  weight is 197 lb (89.4 kg). His blood pressure is 114/59 (abnormal) and his pulse is 65. His respiration is 16 and oxygen saturation is 100%. . Weight essentially stable.  No significant changes. Alert, in no acute distress.   Impression The patient is tolerating radiation.  Plan Continue treatment as planned.         Sheral Apley Tammi Klippel, M.D.   This document serves as a record of services personally performed by [name], MD. It was created on his behalf by Arlyce Harman, a trained medical scribe. The creation of this record is based on the scribe's personal observations and the provider's statements to them. This document has been checked and approved by the attending provider.

## 2015-11-27 NOTE — Progress Notes (Signed)
Weight and vitals stable. Denies pain. Reports nocturia x 3. Denies dysuria or hematuria. Reports leakage and incontinence have resolved. Describes urine stream as steady. Denies difficulty emptying his bladder. Reports new onset of diarrhea maybe 1-2 episodes per day and fatigue.   BP (!) 114/59   Pulse 65   Resp 16   Wt 197 lb (89.4 kg)   SpO2 100%   BMI 29.09 kg/m  Wt Readings from Last 3 Encounters:  11/27/15 197 lb (89.4 kg)  11/18/15 199 lb 4.8 oz (90.4 kg)  11/13/15 200 lb 11.2 oz (91 kg)

## 2015-11-30 ENCOUNTER — Ambulatory Visit
Admission: RE | Admit: 2015-11-30 | Discharge: 2015-11-30 | Disposition: A | Discharge status: Home / Self Care | Payer: BLUE CROSS/BLUE SHIELD | Source: Ambulatory Visit | Attending: Radiation Oncology | Admitting: Radiation Oncology

## 2015-11-30 DIAGNOSIS — Z510 Encounter for antineoplastic radiation therapy: Secondary | ICD-10-CM | POA: Diagnosis not present

## 2015-11-30 DIAGNOSIS — C61 Malignant neoplasm of prostate: Secondary | ICD-10-CM | POA: Diagnosis not present

## 2015-12-01 ENCOUNTER — Ambulatory Visit
Admission: RE | Admit: 2015-12-01 | Discharge: 2015-12-01 | Disposition: A | Discharge status: Home / Self Care | Payer: BLUE CROSS/BLUE SHIELD | Source: Ambulatory Visit | Attending: Radiation Oncology | Admitting: Radiation Oncology

## 2015-12-01 DIAGNOSIS — C61 Malignant neoplasm of prostate: Secondary | ICD-10-CM | POA: Diagnosis not present

## 2015-12-01 DIAGNOSIS — Z510 Encounter for antineoplastic radiation therapy: Secondary | ICD-10-CM | POA: Diagnosis not present

## 2015-12-02 ENCOUNTER — Ambulatory Visit
Admission: RE | Admit: 2015-12-02 | Discharge: 2015-12-02 | Disposition: A | Discharge status: Home / Self Care | Payer: BLUE CROSS/BLUE SHIELD | Source: Ambulatory Visit | Attending: Radiation Oncology | Admitting: Radiation Oncology

## 2015-12-02 DIAGNOSIS — Z510 Encounter for antineoplastic radiation therapy: Secondary | ICD-10-CM | POA: Diagnosis not present

## 2015-12-02 DIAGNOSIS — C61 Malignant neoplasm of prostate: Secondary | ICD-10-CM | POA: Diagnosis not present

## 2015-12-03 ENCOUNTER — Ambulatory Visit
Admission: RE | Admit: 2015-12-03 | Payer: BLUE CROSS/BLUE SHIELD | Source: Ambulatory Visit | Admitting: Radiation Oncology

## 2015-12-03 ENCOUNTER — Ambulatory Visit
Admission: RE | Admit: 2015-12-03 | Discharge: 2015-12-03 | Disposition: A | Discharge status: Home / Self Care | Payer: BLUE CROSS/BLUE SHIELD | Source: Ambulatory Visit | Attending: Radiation Oncology | Admitting: Radiation Oncology

## 2015-12-03 DIAGNOSIS — Z510 Encounter for antineoplastic radiation therapy: Secondary | ICD-10-CM | POA: Diagnosis not present

## 2015-12-03 DIAGNOSIS — C61 Malignant neoplasm of prostate: Secondary | ICD-10-CM | POA: Diagnosis not present

## 2015-12-04 ENCOUNTER — Ambulatory Visit
Admission: RE | Admit: 2015-12-04 | Discharge: 2015-12-04 | Disposition: A | Discharge status: Home / Self Care | Payer: BLUE CROSS/BLUE SHIELD | Source: Ambulatory Visit | Attending: Radiation Oncology | Admitting: Radiation Oncology

## 2015-12-04 ENCOUNTER — Encounter: Payer: Self-pay | Admitting: Radiation Oncology

## 2015-12-04 VITALS — BP 131/73 | HR 60 | Temp 98.1°F | Ht 69.0 in | Wt 200.2 lb

## 2015-12-04 DIAGNOSIS — Z510 Encounter for antineoplastic radiation therapy: Secondary | ICD-10-CM | POA: Diagnosis not present

## 2015-12-04 DIAGNOSIS — C61 Malignant neoplasm of prostate: Secondary | ICD-10-CM

## 2015-12-04 NOTE — Progress Notes (Signed)
  Radiation Oncology         [phone]   Name: Dan Brandt MRN: [id number]   Date: 12/04/2015  DOB: November 10, 1955   Weekly Radiation Therapy Management    ICD-9-CM ICD-10-CM   1. Prostate cancer ([place]) 185 C61     Current Dose: 30.6 Gy  Planned Dose:  68.4 Gy  Narrative The patient presents for routine under treatment assessment. Dan Brandt presents for his 19th fraction of radiation to his Prostate. He denies pain or fatigue. He denies urinary frequency and tells me he will use the bathroom 2-3 times during the night. He reports no burning or hematuria with urination. His urine stream is strong and he does feel like he is emptying his bladder well. He does have some incontinence later in the evening when lifting items. He has been having diarrhea for several weeks after each meal he eats. He is not using any medicines to help this right now..  The patient is without complaint. Set-up films were reviewed. The chart was checked.  Physical Findings  height is 5\' 9"  (1.753 m) and weight is 200 lb 3.2 oz (90.8 kg). His temperature is 98.1 F (36.7 C). His blood pressure is 131/73 and his pulse is 60. His oxygen saturation is 98%. . Weight essentially stable.  No significant changes.  Impression The patient is tolerating radiation.  Plan Continue treatment as planned.    Sheral Apley Tammi Klippel, M.D.  This document serves as a record of services personally performed by [name], MD. It was created on his behalf by Bethann Humble, a trained medical scribe. The creation of this record is based on the scribe's personal observations and the provider's statements to them. This document has been checked and approved by the attending provider.

## 2015-12-04 NOTE — Progress Notes (Signed)
Mr. Witteman presents for his 19th fraction of radiation to his Prostate. He denies pain or fatigue. He denies urinary frequency and tells me he will use the bathroom 2-3 times during the night. He reports no burning or hematuria with urination. His urine stream is strong and he does feel like he is emptying his bladder well. He does have some incontinence later in the evening when lifting items. He has been having diarrhea for several weeks after each meal he eats. He is not using any medicines to help this right now.   BP 131/73   Pulse 60   Temp 98.1 F (36.7 C)   Ht 5\' 9"  (1.753 m)   Wt 200 lb 3.2 oz (90.8 kg)   SpO2 98% Comment: room air  BMI 29.56 kg/m    Wt Readings from Last 3 Encounters:  12/04/15 200 lb 3.2 oz (90.8 kg)  11/27/15 197 lb (89.4 kg)  11/18/15 199 lb 4.8 oz (90.4 kg)

## 2015-12-07 ENCOUNTER — Ambulatory Visit
Admission: RE | Admit: 2015-12-07 | Discharge: 2015-12-07 | Disposition: A | Discharge status: Home / Self Care | Payer: BLUE CROSS/BLUE SHIELD | Source: Ambulatory Visit | Attending: Radiation Oncology | Admitting: Radiation Oncology

## 2015-12-07 DIAGNOSIS — Z510 Encounter for antineoplastic radiation therapy: Secondary | ICD-10-CM | POA: Diagnosis not present

## 2015-12-07 DIAGNOSIS — C61 Malignant neoplasm of prostate: Secondary | ICD-10-CM | POA: Diagnosis not present

## 2015-12-08 ENCOUNTER — Ambulatory Visit
Admission: RE | Admit: 2015-12-08 | Discharge: 2015-12-08 | Disposition: A | Discharge status: Home / Self Care | Payer: BLUE CROSS/BLUE SHIELD | Source: Ambulatory Visit | Attending: Radiation Oncology | Admitting: Radiation Oncology

## 2015-12-08 DIAGNOSIS — Z510 Encounter for antineoplastic radiation therapy: Secondary | ICD-10-CM | POA: Diagnosis not present

## 2015-12-08 DIAGNOSIS — C61 Malignant neoplasm of prostate: Secondary | ICD-10-CM | POA: Diagnosis not present

## 2015-12-09 ENCOUNTER — Ambulatory Visit
Admission: RE | Admit: 2015-12-09 | Discharge: 2015-12-09 | Disposition: A | Discharge status: Home / Self Care | Payer: BLUE CROSS/BLUE SHIELD | Source: Ambulatory Visit | Attending: Radiation Oncology | Admitting: Radiation Oncology

## 2015-12-09 DIAGNOSIS — C61 Malignant neoplasm of prostate: Secondary | ICD-10-CM | POA: Diagnosis not present

## 2015-12-09 DIAGNOSIS — Z510 Encounter for antineoplastic radiation therapy: Secondary | ICD-10-CM | POA: Diagnosis not present

## 2015-12-10 ENCOUNTER — Ambulatory Visit
Admission: RE | Admit: 2015-12-10 | Discharge: 2015-12-10 | Disposition: A | Discharge status: Home / Self Care | Payer: BLUE CROSS/BLUE SHIELD | Source: Ambulatory Visit | Attending: Radiation Oncology | Admitting: Radiation Oncology

## 2015-12-10 VITALS — BP 128/82 | HR 59 | Resp 16 | Wt 200.8 lb

## 2015-12-10 DIAGNOSIS — Z510 Encounter for antineoplastic radiation therapy: Secondary | ICD-10-CM | POA: Diagnosis not present

## 2015-12-10 DIAGNOSIS — C61 Malignant neoplasm of prostate: Secondary | ICD-10-CM | POA: Diagnosis not present

## 2015-12-10 NOTE — Progress Notes (Signed)
Weight and vitals stable. Denies pain. Reports nocturia x 4-5. Explains that he can easily return to slumber following urination at night. Reports urinary frequency has increased to every hour. However, reports PCP approximately 30 days ago added HCTZ to his lisinopril. Patient recognizes HCTZ is contributing to frequency. Denies dysuria or hematuria. Denies urgency, leakage or incontinence. Reports loose bowel are less frequent. Reports fatigue late afternoons.   BP 128/82 (BP Location: Left Arm, Patient Position: Sitting, Cuff Size: Normal)   Pulse (!) 59   Resp 16   Wt 200 lb 12.8 oz (91.1 kg)   SpO2 100%   BMI 29.65 kg/m  Wt Readings from Last 3 Encounters:  12/10/15 200 lb 12.8 oz (91.1 kg)  12/04/15 200 lb 3.2 oz (90.8 kg)  11/27/15 197 lb (89.4 kg)

## 2015-12-10 NOTE — Progress Notes (Signed)
  Radiation Oncology         [phone]   Name: Dan Brandt MRN: [id number]   Date: 12/10/2015  DOB: 1956-02-27    Weekly Radiation Therapy Management    ICD-9-CM ICD-10-CM   1. Prostate cancer ([place]) 185 C61     Current Dose: 41.4 Gy  Planned Dose:  68.4 Gy  Narrative The patient presents for routine under treatment assessment.  Weight and vitals stable. Denies pain. Reports nocturia x 4-5. Explains that he can easily return to sleep following nocturia. Reports urinary frequency has increased to every hour. This is a new symptom as of two weeks ago. However, reports PCP, approximately 30 days ago, added HCTZ to his lisinopril. Patient recognizes HCTZ is contributing to frequency. Denies dysuria or hematuria. Denies urgency, leakage, or incontinence. Reports loose bowel are less frequent. Reports fatigue in the afternoon.  Set-up films were reviewed. The chart was checked.  Physical Findings  weight is 200 lb 12.8 oz (91.1 kg). His blood pressure is 128/82 and his pulse is 59 (abnormal). His respiration is 16 and oxygen saturation is 100%. . Weight essentially stable.  No significant changes.  Impression The patient is tolerating radiation.  Plan Continue treatment as planned.    Sheral Apley Tammi Klippel, M.D.  This document serves as a record of services personally performed by [name], MD. It was created on his behalf by Bethann Humble, a trained medical scribe. The creation of this record is based on the scribe's personal observations and the provider's statements to them. This document has been checked and approved by the attending provider.

## 2015-12-11 ENCOUNTER — Ambulatory Visit
Admission: RE | Admit: 2015-12-11 | Discharge: 2015-12-11 | Disposition: A | Discharge status: Home / Self Care | Payer: BLUE CROSS/BLUE SHIELD | Source: Ambulatory Visit | Attending: Radiation Oncology | Admitting: Radiation Oncology

## 2015-12-11 DIAGNOSIS — C61 Malignant neoplasm of prostate: Secondary | ICD-10-CM | POA: Diagnosis not present

## 2015-12-11 DIAGNOSIS — Z510 Encounter for antineoplastic radiation therapy: Secondary | ICD-10-CM | POA: Diagnosis not present

## 2015-12-15 ENCOUNTER — Ambulatory Visit
Admission: RE | Admit: 2015-12-15 | Discharge: 2015-12-15 | Disposition: A | Discharge status: Home / Self Care | Payer: BLUE CROSS/BLUE SHIELD | Source: Ambulatory Visit | Attending: Radiation Oncology | Admitting: Radiation Oncology

## 2015-12-15 DIAGNOSIS — C61 Malignant neoplasm of prostate: Secondary | ICD-10-CM | POA: Diagnosis not present

## 2015-12-15 DIAGNOSIS — Z510 Encounter for antineoplastic radiation therapy: Secondary | ICD-10-CM | POA: Diagnosis not present

## 2015-12-16 ENCOUNTER — Ambulatory Visit
Admission: RE | Admit: 2015-12-16 | Discharge: 2015-12-16 | Disposition: A | Discharge status: Home / Self Care | Payer: BLUE CROSS/BLUE SHIELD | Source: Ambulatory Visit | Attending: Radiation Oncology | Admitting: Radiation Oncology

## 2015-12-16 DIAGNOSIS — Z510 Encounter for antineoplastic radiation therapy: Secondary | ICD-10-CM | POA: Diagnosis not present

## 2015-12-16 DIAGNOSIS — C61 Malignant neoplasm of prostate: Secondary | ICD-10-CM | POA: Diagnosis not present

## 2015-12-17 ENCOUNTER — Ambulatory Visit
Admission: RE | Admit: 2015-12-17 | Discharge: 2015-12-17 | Disposition: A | Discharge status: Home / Self Care | Payer: BLUE CROSS/BLUE SHIELD | Source: Ambulatory Visit | Attending: Radiation Oncology | Admitting: Radiation Oncology

## 2015-12-17 DIAGNOSIS — C61 Malignant neoplasm of prostate: Secondary | ICD-10-CM | POA: Diagnosis not present

## 2015-12-17 DIAGNOSIS — Z510 Encounter for antineoplastic radiation therapy: Secondary | ICD-10-CM | POA: Diagnosis not present

## 2015-12-18 ENCOUNTER — Ambulatory Visit
Admission: RE | Admit: 2015-12-18 | Discharge: 2015-12-18 | Disposition: A | Discharge status: Home / Self Care | Payer: BLUE CROSS/BLUE SHIELD | Source: Ambulatory Visit | Attending: Radiation Oncology | Admitting: Radiation Oncology

## 2015-12-18 ENCOUNTER — Encounter: Payer: Self-pay | Admitting: Radiation Oncology

## 2015-12-18 VITALS — BP 128/83 | HR 56 | Temp 98.1°F | Resp 20 | Wt 200.1 lb

## 2015-12-18 DIAGNOSIS — C61 Malignant neoplasm of prostate: Secondary | ICD-10-CM | POA: Diagnosis not present

## 2015-12-18 DIAGNOSIS — Z510 Encounter for antineoplastic radiation therapy: Secondary | ICD-10-CM | POA: Diagnosis not present

## 2015-12-18 NOTE — Progress Notes (Addendum)
Weekly rd txs prostate  28/38  Completed, has dysuria now and strains some to empty  His bladder but feels empties it fully,  voids every hour, frequency increased, nocturia x3, , no hematuria , comes with a full bladder, main c/o right hip pain past few days, taking 1000mg  tylenol which helps, appetite good, energy level poor, loose bowels 3-4 daily 3:50 PM There were no vitals taken for this visit.  Wt Readings from Last 3 Encounters:  12/10/15 200 lb 12.8 oz (91.1 kg)  12/04/15 200 lb 3.2 oz (90.8 kg)  11/27/15 197 lb (89.4 kg)  BP 128/83 (BP Location: Left Arm, Patient Position: Sitting, Cuff Size: Normal)   Pulse (!) 56   Temp 98.1 F (36.7 C) (Oral)   Resp 20   Wt 200 lb 1.6 oz (90.8 kg)   BMI 29.55 kg/m

## 2015-12-18 NOTE — Progress Notes (Signed)
  Radiation Oncology         [phone]   Name: Dan Brandt MRN: [id number]   Date: 12/18/2015  DOB: [birth year]    Weekly Radiation Therapy Management    ICD-9-CM ICD-10-CM   1. Prostate cancer ([place]) 185 C61     Current Dose: 50.4 Gy  Planned Dose:  68.4 Gy  Narrative The patient presents for routine under treatment assessment.  Patient complains of dysuria and strains to empty. He notes that he empties it fully. He notes he voids every hour. He has increased frequency. He reports nocturia x 3. He denies hematuria. His main complaint is right hip pain in the past few days. He alleviates the pain with 1000 mg Tylenol. He notes a good appetite, and a poor energy level. He notes loose bowels 3-4 times per day.  Set-up films were reviewed. The chart was checked.  Physical Findings  weight is 200 lb 1.6 oz (90.8 kg). His oral temperature is 98.1 F (36.7 C). His blood pressure is 128/83 and his pulse is 56 (abnormal). His respiration is 20. . Weight essentially stable.  No significant changes.  Impression The patient is tolerating radiation.  Plan Continue treatment as planned.    Sheral Apley Tammi Klippel, M.D.  This document serves as a record of services personally performed by [name], MD. It was created on his behalf by Bethann Humble, a trained medical scribe. The creation of this record is based on the scribe's personal observations and the provider's statements to them. This document has been checked and approved by the attending provider.

## 2015-12-21 ENCOUNTER — Ambulatory Visit
Admission: RE | Admit: 2015-12-21 | Discharge: 2015-12-21 | Disposition: A | Discharge status: Home / Self Care | Payer: BLUE CROSS/BLUE SHIELD | Source: Ambulatory Visit | Attending: Radiation Oncology | Admitting: Radiation Oncology

## 2015-12-21 DIAGNOSIS — Z510 Encounter for antineoplastic radiation therapy: Secondary | ICD-10-CM | POA: Diagnosis not present

## 2015-12-21 DIAGNOSIS — C61 Malignant neoplasm of prostate: Secondary | ICD-10-CM | POA: Diagnosis not present

## 2015-12-22 ENCOUNTER — Ambulatory Visit
Admission: RE | Admit: 2015-12-22 | Discharge: 2015-12-22 | Disposition: A | Discharge status: Home / Self Care | Payer: BLUE CROSS/BLUE SHIELD | Source: Ambulatory Visit | Attending: Radiation Oncology | Admitting: Radiation Oncology

## 2015-12-22 DIAGNOSIS — C61 Malignant neoplasm of prostate: Secondary | ICD-10-CM | POA: Diagnosis not present

## 2015-12-22 DIAGNOSIS — Z510 Encounter for antineoplastic radiation therapy: Secondary | ICD-10-CM | POA: Diagnosis not present

## 2015-12-23 ENCOUNTER — Ambulatory Visit
Admission: RE | Admit: 2015-12-23 | Discharge: 2015-12-23 | Disposition: A | Discharge status: Home / Self Care | Payer: BLUE CROSS/BLUE SHIELD | Source: Ambulatory Visit | Attending: Radiation Oncology | Admitting: Radiation Oncology

## 2015-12-23 DIAGNOSIS — C61 Malignant neoplasm of prostate: Secondary | ICD-10-CM | POA: Diagnosis not present

## 2015-12-23 DIAGNOSIS — Z510 Encounter for antineoplastic radiation therapy: Secondary | ICD-10-CM | POA: Diagnosis not present

## 2015-12-24 ENCOUNTER — Ambulatory Visit
Admission: RE | Admit: 2015-12-24 | Discharge: 2015-12-24 | Disposition: A | Discharge status: Home / Self Care | Payer: BLUE CROSS/BLUE SHIELD | Source: Ambulatory Visit | Attending: Radiation Oncology | Admitting: Radiation Oncology

## 2015-12-24 DIAGNOSIS — C61 Malignant neoplasm of prostate: Secondary | ICD-10-CM | POA: Diagnosis not present

## 2015-12-24 DIAGNOSIS — Z510 Encounter for antineoplastic radiation therapy: Secondary | ICD-10-CM | POA: Diagnosis not present

## 2015-12-25 ENCOUNTER — Ambulatory Visit: Payer: BLUE CROSS/BLUE SHIELD | Admitting: Radiation Oncology

## 2015-12-25 ENCOUNTER — Ambulatory Visit: Payer: BLUE CROSS/BLUE SHIELD

## 2015-12-28 ENCOUNTER — Encounter: Payer: Self-pay | Admitting: Radiation Oncology

## 2015-12-28 ENCOUNTER — Encounter: Payer: Self-pay | Admitting: Medical Oncology

## 2015-12-28 ENCOUNTER — Ambulatory Visit
Admission: RE | Admit: 2015-12-28 | Discharge: 2015-12-28 | Disposition: A | Discharge status: Home / Self Care | Payer: BLUE CROSS/BLUE SHIELD | Source: Ambulatory Visit | Attending: Radiation Oncology | Admitting: Radiation Oncology

## 2015-12-28 ENCOUNTER — Ambulatory Visit: Admission: RE | Admit: 2015-12-28 | Payer: BLUE CROSS/BLUE SHIELD | Source: Ambulatory Visit

## 2015-12-28 VITALS — BP 126/78 | HR 63 | Temp 97.8°F | Resp 18 | Ht 69.0 in | Wt 201.5 lb

## 2015-12-28 DIAGNOSIS — C61 Malignant neoplasm of prostate: Secondary | ICD-10-CM

## 2015-12-28 NOTE — Progress Notes (Signed)
Weekly rd txs prostate  32/38  Completed, has dysuria now and strains some to empty.  His bladder but feels empties it fully,  voids every hour, frequency increased, nocturia 4-5 times, , no hematuria , comes with a full bladder,  right hip pain has resolved, has not taken 1000mg  tylenol in about a week, appetite good, energy level poor, loose bowels 3-4 daily noticed blood in stools over the weekend. Wt Readings from Last 3 Encounters:  12/28/15 201 lb 8 oz (91.4 kg)  12/18/15 200 lb 1.6 oz (90.8 kg)  12/10/15 200 lb 12.8 oz (91.1 kg)  BP 126/78 (BP Location: Right Arm, Patient Position: Sitting, Cuff Size: Normal)   Pulse 63   Temp 97.8 F (36.6 C) (Oral)   Resp 18   Ht 5\' 9"  (1.753 m)   Wt 201 lb 8 oz (91.4 kg)   SpO2 100%   BMI 29.76 kg/m

## 2015-12-28 NOTE — Progress Notes (Signed)
  Radiation Oncology         [id number]   Name: Dan Brandt MRN: [id number]   Date: 12/28/2015  DOB: March 18, 1956    Weekly Radiation Therapy Management    ICD-9-CM ICD-10-CM   1. Prostate cancer ([place]) 185 C61     Current Dose: 57.6 Gy  Planned Dose:  68.4 Gy  Narrative The patient presents for routine under treatment assessment.  Patient notes dysuria and strains to empty his bladder. However, he feels it empty fully. He voids every hour, and notes his urinary frequency has increase. He reports nocturia x 4-5. He denies hematuria. He comes in with a full bladder. He notes his right hip pain has resolved. He has not taken 1000 mg Tylenol in about a week. He notes a good appetite but a poor energy level. He reports loose bowels 3-4 daily and some blood in stools over the weekend.  Set-up films were reviewed. The chart was checked.  Physical Findings  height is 5\' 9"  (1.753 m) and weight is 201 lb 8 oz (91.4 kg). His oral temperature is 97.8 F (36.6 C). His blood pressure is 126/78 and his pulse is 63. His respiration is 18 and oxygen saturation is 100%. . Weight essentially stable.  No significant changes.  Impression The patient is tolerating radiation.  Plan Continue treatment as planned. Could consider Proctosol-HC suppositories if rectal bleeding persists.    Sheral Apley Tammi Klippel, M.D.  This document serves as a record of services personally performed by [name], MD. It was created on his behalf by Bethann Humble, a trained medical scribe. The creation of this record is based on the scribe's personal observations and the provider's statements to them. This document has been checked and approved by the attending provider.

## 2015-12-29 ENCOUNTER — Ambulatory Visit
Admission: RE | Admit: 2015-12-29 | Discharge: 2015-12-29 | Disposition: A | Discharge status: Home / Self Care | Payer: BLUE CROSS/BLUE SHIELD | Source: Ambulatory Visit | Attending: Radiation Oncology | Admitting: Radiation Oncology

## 2015-12-29 DIAGNOSIS — C61 Malignant neoplasm of prostate: Secondary | ICD-10-CM | POA: Diagnosis not present

## 2015-12-29 DIAGNOSIS — Z510 Encounter for antineoplastic radiation therapy: Secondary | ICD-10-CM | POA: Diagnosis not present

## 2015-12-30 ENCOUNTER — Ambulatory Visit
Admission: RE | Admit: 2015-12-30 | Discharge: 2015-12-30 | Disposition: A | Discharge status: Home / Self Care | Payer: BLUE CROSS/BLUE SHIELD | Source: Ambulatory Visit | Attending: Radiation Oncology | Admitting: Radiation Oncology

## 2015-12-30 DIAGNOSIS — Z510 Encounter for antineoplastic radiation therapy: Secondary | ICD-10-CM | POA: Diagnosis not present

## 2015-12-30 DIAGNOSIS — C61 Malignant neoplasm of prostate: Secondary | ICD-10-CM | POA: Diagnosis not present

## 2015-12-31 ENCOUNTER — Ambulatory Visit
Admission: RE | Admit: 2015-12-31 | Discharge: 2015-12-31 | Disposition: A | Discharge status: Home / Self Care | Payer: BLUE CROSS/BLUE SHIELD | Source: Ambulatory Visit | Attending: Radiation Oncology | Admitting: Radiation Oncology

## 2015-12-31 DIAGNOSIS — C61 Malignant neoplasm of prostate: Secondary | ICD-10-CM | POA: Diagnosis not present

## 2015-12-31 DIAGNOSIS — Z510 Encounter for antineoplastic radiation therapy: Secondary | ICD-10-CM | POA: Diagnosis not present

## 2016-01-01 ENCOUNTER — Ambulatory Visit: Payer: BLUE CROSS/BLUE SHIELD

## 2016-01-01 ENCOUNTER — Ambulatory Visit
Admission: RE | Admit: 2016-01-01 | Discharge: 2016-01-01 | Disposition: A | Discharge status: Home / Self Care | Payer: BLUE CROSS/BLUE SHIELD | Source: Ambulatory Visit | Attending: Radiation Oncology | Admitting: Radiation Oncology

## 2016-01-01 ENCOUNTER — Encounter: Payer: Self-pay | Admitting: Radiation Oncology

## 2016-01-01 VITALS — BP 132/62 | HR 70 | Temp 97.9°F | Resp 18 | Ht 69.0 in | Wt 199.9 lb

## 2016-01-01 DIAGNOSIS — Z510 Encounter for antineoplastic radiation therapy: Secondary | ICD-10-CM | POA: Diagnosis not present

## 2016-01-01 DIAGNOSIS — C61 Malignant neoplasm of prostate: Secondary | ICD-10-CM | POA: Diagnosis not present

## 2016-01-01 NOTE — Progress Notes (Signed)
  Radiation Oncology         [phone]   Name: Dan Brandt MRN: [id number]   Date: 01/01/2016  DOB: Mar 18, 1956    Weekly Radiation Therapy Management    ICD-9-CM ICD-10-CM   1. Prostate cancer ([place]) 185 C61     Current Dose: 64.8 Gy  Planned Dose:  68.4 Gy  Narrative The patient presents for routine under treatment assessment.  Patient notes dysuria and strains to empty his bladder. However, he feels it empty fully. He voids every hour, and notes his urinary frequency has increase. He reports nocturia x 4-5. He denies hematuria. He comes in with a full bladder.  He notes a good appetite but a poor energy level. He reports loose bowels 3-4 daily and some blood in stools over the weekend.   Set-up films were reviewed. The chart was checked.  Physical Findings  height is 5\' 9"  (1.753 m) and weight is 199 lb 14.4 oz (90.7 kg). His oral temperature is 97.9 F (36.6 C). His blood pressure is 132/62 and his pulse is 70. His respiration is 18 and oxygen saturation is 98%. . Weight essentially stable.  No significant changes.  Impression The patient is tolerating radiation.  Plan Continue treatment as planned. One month follow up card given to come back to see Shona Simpson, P.A.    Sheral Apley Tammi Klippel, M.D.  This document serves as a record of services personally performed by [name], MD. It was created on his behalf by Bethann Humble, a trained medical scribe. The creation of this record is based on the scribe's personal observations and the provider's statements to them. This document has been checked and approved by the attending provider.

## 2016-01-01 NOTE — Progress Notes (Addendum)
Weekly rd txs prostate 36/38 Completed, has dysuria mild now and strains some to empty. He is empyting his bladder, voids every hour, frequency increased, nocturia 4-5 times, , no hematuria , comes with a full bladder.  Appetite good, energy level poor, loose bowels 3-4 daily noticed blood in stools.  One month follow up card given to come back to see Shona Simpson, P.A.  See EOT documentation in the education area. Wt Readings from Last 3 Encounters:  01/01/16 199 lb 14.4 oz (90.7 kg)  12/28/15 201 lb 8 oz (91.4 kg)  12/18/15 200 lb 1.6 oz (90.8 kg)  BP 132/62 (BP Location: Left Arm, Patient Position: Sitting, Cuff Size: Normal)   Pulse 70   Temp 97.9 F (36.6 C) (Oral)   Resp 18   Ht 5\' 9"  (1.753 m)   Wt 199 lb 14.4 oz (90.7 kg)   SpO2 98%   BMI 29.52 kg/m

## 2016-01-04 ENCOUNTER — Ambulatory Visit
Admission: RE | Admit: 2016-01-04 | Discharge: 2016-01-04 | Disposition: A | Discharge status: Home / Self Care | Payer: BLUE CROSS/BLUE SHIELD | Source: Ambulatory Visit | Attending: Radiation Oncology | Admitting: Radiation Oncology

## 2016-01-04 ENCOUNTER — Ambulatory Visit: Payer: BLUE CROSS/BLUE SHIELD

## 2016-01-04 DIAGNOSIS — Z510 Encounter for antineoplastic radiation therapy: Secondary | ICD-10-CM | POA: Diagnosis not present

## 2016-01-04 DIAGNOSIS — C61 Malignant neoplasm of prostate: Secondary | ICD-10-CM | POA: Diagnosis not present

## 2016-01-05 ENCOUNTER — Encounter: Payer: Self-pay | Admitting: Radiation Oncology

## 2016-01-05 ENCOUNTER — Ambulatory Visit: Payer: BLUE CROSS/BLUE SHIELD

## 2016-01-05 ENCOUNTER — Ambulatory Visit
Admission: RE | Admit: 2016-01-05 | Discharge: 2016-01-05 | Disposition: A | Discharge status: Home / Self Care | Payer: BLUE CROSS/BLUE SHIELD | Source: Ambulatory Visit | Attending: Radiation Oncology | Admitting: Radiation Oncology

## 2016-01-05 DIAGNOSIS — C61 Malignant neoplasm of prostate: Secondary | ICD-10-CM | POA: Diagnosis not present

## 2016-01-05 DIAGNOSIS — Z510 Encounter for antineoplastic radiation therapy: Secondary | ICD-10-CM | POA: Diagnosis not present

## 2016-01-06 ENCOUNTER — Ambulatory Visit: Payer: BLUE CROSS/BLUE SHIELD

## 2016-01-14 NOTE — Progress Notes (Signed)
  Radiation Oncology         [phone]) [phone] ________________________________  Name: Dan Brandt MRN: [id number]  Date: 01/05/2016  DOB: Jun 15, 1955  End of Treatment Note  Diagnosis:   60 yo man with detectable PSA s/p prostatectomy for pT3b, pN0 adenocarcinoma with Gleason 4+4     Indication for treatment:  Curative, Prostatic Fossa Radiotherapy       Radiation treatment dates:   11/10/2015 to 01/05/2016  Site/dose:   The prostatic fossa was treated to 68.4 Gy in 38 fractions of 1.8 Gy  Beams/energy:   The prostatic fossa was treated using helical intensity modulated radiotherapy delivering 6 megavolt photons. Image guidance was performed with megavoltage CT studies prior to each fraction. He was immobilized with a body fix lower extremity mold.  Narrative: The patient tolerated radiation treatment relatively well.  He experienced mild fatigue and minor urinary irritation including, dysuria, frequency, nocturia x 4-5, and straining to empty his bladder. He also reported loose bowels 3-4 times daily.    Plan: The patient has completed radiation treatment. He will return to radiation oncology clinic for routine followup in one month. I advised him to call or return sooner if he has any questions or concerns related to his recovery or treatment. ________________________________  Sheral Apley. Tammi Klippel, M.D.  This document serves as a record of services personally performed by [name], MD. It was created on his behalf by Arlyce Harman, a trained medical scribe. The creation of this record is based on the scribe's personal observations and the provider's statements to them. This document has been checked and approved by the attending provider.

## 2016-01-27 ENCOUNTER — Telehealth: Payer: Self-pay | Admitting: Radiation Oncology

## 2016-01-27 NOTE — Telephone Encounter (Signed)
Phoned patient to inquire about status. Patient confirms blood in stool has resolved. Patient denies any additional needs at this time. Confirmed 02/09/16 1315 appointment with Shona Simpson, PA-C

## 2016-02-09 ENCOUNTER — Encounter: Payer: Self-pay | Admitting: Radiation Oncology

## 2016-02-09 ENCOUNTER — Encounter: Payer: Self-pay | Admitting: Medical Oncology

## 2016-02-09 ENCOUNTER — Ambulatory Visit
Admission: RE | Admit: 2016-02-09 | Discharge: 2016-02-09 | Disposition: A | Discharge status: Home / Self Care | Payer: BLUE CROSS/BLUE SHIELD | Source: Ambulatory Visit | Attending: Radiation Oncology | Admitting: Radiation Oncology

## 2016-02-09 VITALS — BP 136/74 | HR 57 | Temp 98.0°F | Ht 69.0 in | Wt 202.6 lb

## 2016-02-09 DIAGNOSIS — Z79899 Other long term (current) drug therapy: Secondary | ICD-10-CM | POA: Insufficient documentation

## 2016-02-09 DIAGNOSIS — C61 Malignant neoplasm of prostate: Secondary | ICD-10-CM | POA: Insufficient documentation

## 2016-02-09 DIAGNOSIS — R001 Bradycardia, unspecified: Secondary | ICD-10-CM | POA: Insufficient documentation

## 2016-02-09 DIAGNOSIS — Z923 Personal history of irradiation: Secondary | ICD-10-CM | POA: Diagnosis not present

## 2016-02-09 NOTE — Progress Notes (Signed)
Mr. Kipps here for reassessment S/P XRT for Prostate cancer which completed on 01/05/16.  He states he has an appt. On 04/22/16 with Dr. Alinda Money with labs 1 week prior to this visit.  He reports he is voiding, but he "cannot hold his urine" and has frequent urgency, with dribbling,however he empties completely.  Late in the day he uses a pad.  Nocturia x 4, but he states it is getting better, with linger time frames of the need to void.  Denies any dysuria when voiding.   BP 136/74 (BP Location: Left Arm, Patient Position: Sitting, Cuff Size: Normal)   Pulse (!) 57   Temp 98 F (36.7 C) (Oral)   Ht 5\' 9"  (1.753 m)   Wt 202 lb 9.6 oz (91.9 kg)   BMI 29.92 kg/m    Wt Readings from Last 3 Encounters:  02/09/16 202 lb 9.6 oz (91.9 kg)  01/01/16 199 lb 14.4 oz (90.7 kg)  12/28/15 201 lb 8 oz (91.4 kg)

## 2016-02-09 NOTE — Progress Notes (Signed)
Radiation Oncology         [phone]) [phone] ________________________________  Name: Dan Brandt MRN: [id number]  Date: 02/09/2016  DOB: 1956/03/20  Post Treatment Note  CC: Gar Ponto, MD  Raynelle Bring, MD  Diagnosis:   60 yo man with detectable PSA s/p prostatectomy for pT3b, pN0 adenocarcinoma with Gleason 4+4     Interval Since Last Radiation:  6 weeks   11/10/2015 to 01/05/2016: The prostatic fossa was treated to 68.4 Gy in 38 fractions of 1.8 Gy  Narrative:  The patient returns today for routine follow-up. He tolerated radiotherapy, but did have some difficulty with fatigue and increased incontinence. He is back to doing his pelvic floor exercises.                            On review of systems, the patient states he is doing pretty well he notices occasional dribbling and urinary leaking if he holds his urine for long. He is however trying to "stretch" his bladder more so he doesn't have such urinary frequency. He reports that later in the day is when he notes most of his leaking or dribbling. He reports that he has up to 4 episode of nocturia but has not been fluid restricting. No other complaints are noted.  ALLERGIES:  has No Known Allergies.  Meds: Current Outpatient Prescriptions  Medication Sig Dispense Refill  . acetaminophen (TYLENOL) 500 MG chewable tablet Chew 1,000 mg by mouth every 6 (six) hours as needed for pain (pain right hip).    Marland Kitchen lisinopril-hydrochlorothiazide (PRINZIDE,ZESTORETIC) 20-12.5 MG tablet Take 1 tablet by mouth daily.   0  . loratadine (CLARITIN) 10 MG tablet Take 10 mg by mouth daily.    Marland Kitchen lovastatin (MEVACOR) 20 MG tablet Take 20 mg by mouth daily.   2  . omeprazole (PRILOSEC) 20 MG capsule Take 20 mg by mouth daily.     No current facility-administered medications for this encounter.     Physical Findings:  height is 5\' 9"  (1.753 m) and weight is 202 lb 9.6 oz (91.9 kg). His oral temperature is 98 F (36.7 C). His blood pressure is  136/74 and his pulse is 57 (abnormal).  In general this is a well appearing caucasian male  in no acute distress. He's alert and oriented x4 and appropriate throughout the examination. Cardiopulmonary assessment is negative for acute distress and he exhibits normal effort.   Lab Findings: Lab Results  Component Value Date   WBC 4.6 04/17/2015   HGB 12.3 (L) 04/24/2015   HCT 36.3 (L) 04/24/2015   MCV 88.1 04/17/2015   PLT 153 04/17/2015     Radiographic Findings: No results found.  Impression/Plan: 40. 60 yo man with detectable PSA s/p prostatectomy for pT3b, pN0 adenocarcinoma with Gleason 4+4. The patient's symptoms have improved over the last week to week and a half. We discussed the importance of close follow up with urology and he is scheduled to be seen with PSA in January 2018 with Dr. Alinda Money. We'd be happy to see him back as needed moving forward.    2. Survivorship. The patient lives 45 minutes away and is interested in receiving a care plan by mail but not for a face to face visit. I will notify Mike Craze, NP in survivorship clinic. 3. Bradycardia. The patient has a family history of this and is not on any medication for this condition. He is otherwise asymptomatic and will follow up  with his PCP for management of this.    Carola Rhine, PAC

## 2016-02-15 NOTE — Progress Notes (Signed)
Introduced myself to patient and my role as navigator. Will continue to follow.

## 2016-04-15 DIAGNOSIS — C61 Malignant neoplasm of prostate: Secondary | ICD-10-CM | POA: Diagnosis not present

## 2016-04-22 DIAGNOSIS — C61 Malignant neoplasm of prostate: Secondary | ICD-10-CM | POA: Diagnosis not present

## 2016-04-26 DIAGNOSIS — C61 Malignant neoplasm of prostate: Secondary | ICD-10-CM | POA: Diagnosis not present

## 2016-04-26 DIAGNOSIS — F329 Major depressive disorder, single episode, unspecified: Secondary | ICD-10-CM | POA: Diagnosis not present

## 2016-04-26 DIAGNOSIS — Z6828 Body mass index (BMI) 28.0-28.9, adult: Secondary | ICD-10-CM | POA: Diagnosis not present

## 2016-04-26 DIAGNOSIS — J209 Acute bronchitis, unspecified: Secondary | ICD-10-CM | POA: Diagnosis not present

## 2016-05-13 ENCOUNTER — Telehealth: Payer: Self-pay | Admitting: *Deleted

## 2016-05-13 NOTE — Telephone Encounter (Signed)
On 05-12-16 the pt came by pick up medical records

## 2016-05-24 DIAGNOSIS — C61 Malignant neoplasm of prostate: Secondary | ICD-10-CM | POA: Diagnosis not present

## 2016-05-24 DIAGNOSIS — J209 Acute bronchitis, unspecified: Secondary | ICD-10-CM | POA: Diagnosis not present

## 2016-05-24 DIAGNOSIS — Z6828 Body mass index (BMI) 28.0-28.9, adult: Secondary | ICD-10-CM | POA: Diagnosis not present

## 2016-06-01 DIAGNOSIS — Z79899 Other long term (current) drug therapy: Secondary | ICD-10-CM | POA: Diagnosis not present

## 2016-06-01 DIAGNOSIS — F329 Major depressive disorder, single episode, unspecified: Secondary | ICD-10-CM | POA: Diagnosis not present

## 2016-06-01 DIAGNOSIS — C61 Malignant neoplasm of prostate: Secondary | ICD-10-CM | POA: Diagnosis not present

## 2016-06-01 DIAGNOSIS — Z87891 Personal history of nicotine dependence: Secondary | ICD-10-CM | POA: Diagnosis not present

## 2016-06-01 DIAGNOSIS — Z923 Personal history of irradiation: Secondary | ICD-10-CM | POA: Diagnosis not present

## 2016-06-01 DIAGNOSIS — Z9079 Acquired absence of other genital organ(s): Secondary | ICD-10-CM | POA: Diagnosis not present

## 2016-06-01 DIAGNOSIS — E785 Hyperlipidemia, unspecified: Secondary | ICD-10-CM | POA: Diagnosis not present

## 2016-06-01 DIAGNOSIS — R45851 Suicidal ideations: Secondary | ICD-10-CM | POA: Diagnosis not present

## 2016-06-01 DIAGNOSIS — I10 Essential (primary) hypertension: Secondary | ICD-10-CM | POA: Diagnosis not present

## 2016-06-27 DIAGNOSIS — E785 Hyperlipidemia, unspecified: Secondary | ICD-10-CM | POA: Diagnosis not present

## 2016-06-27 DIAGNOSIS — Z8546 Personal history of malignant neoplasm of prostate: Secondary | ICD-10-CM | POA: Diagnosis not present

## 2016-06-27 DIAGNOSIS — N132 Hydronephrosis with renal and ureteral calculous obstruction: Secondary | ICD-10-CM | POA: Diagnosis not present

## 2016-06-27 DIAGNOSIS — Z79899 Other long term (current) drug therapy: Secondary | ICD-10-CM | POA: Diagnosis not present

## 2016-06-27 DIAGNOSIS — K5730 Diverticulosis of large intestine without perforation or abscess without bleeding: Secondary | ICD-10-CM | POA: Diagnosis not present

## 2016-06-27 DIAGNOSIS — I10 Essential (primary) hypertension: Secondary | ICD-10-CM | POA: Diagnosis not present

## 2016-06-27 DIAGNOSIS — K219 Gastro-esophageal reflux disease without esophagitis: Secondary | ICD-10-CM | POA: Diagnosis not present

## 2016-06-27 DIAGNOSIS — R1032 Left lower quadrant pain: Secondary | ICD-10-CM | POA: Diagnosis not present

## 2016-06-27 DIAGNOSIS — Z87891 Personal history of nicotine dependence: Secondary | ICD-10-CM | POA: Diagnosis not present

## 2016-06-27 DIAGNOSIS — N201 Calculus of ureter: Secondary | ICD-10-CM | POA: Diagnosis not present

## 2016-06-28 DIAGNOSIS — K5730 Diverticulosis of large intestine without perforation or abscess without bleeding: Secondary | ICD-10-CM | POA: Diagnosis not present

## 2016-06-28 DIAGNOSIS — N132 Hydronephrosis with renal and ureteral calculous obstruction: Secondary | ICD-10-CM | POA: Diagnosis not present

## 2016-08-19 DIAGNOSIS — H25813 Combined forms of age-related cataract, bilateral: Secondary | ICD-10-CM | POA: Diagnosis not present

## 2016-08-19 DIAGNOSIS — H353211 Exudative age-related macular degeneration, right eye, with active choroidal neovascularization: Secondary | ICD-10-CM | POA: Diagnosis not present

## 2016-08-19 DIAGNOSIS — H43813 Vitreous degeneration, bilateral: Secondary | ICD-10-CM | POA: Diagnosis not present

## 2016-08-19 DIAGNOSIS — H353122 Nonexudative age-related macular degeneration, left eye, intermediate dry stage: Secondary | ICD-10-CM | POA: Diagnosis not present

## 2016-08-31 DIAGNOSIS — C61 Malignant neoplasm of prostate: Secondary | ICD-10-CM | POA: Diagnosis not present

## 2016-10-07 DIAGNOSIS — Z923 Personal history of irradiation: Secondary | ICD-10-CM | POA: Diagnosis not present

## 2016-10-07 DIAGNOSIS — R300 Dysuria: Secondary | ICD-10-CM | POA: Diagnosis not present

## 2016-10-07 DIAGNOSIS — C61 Malignant neoplasm of prostate: Secondary | ICD-10-CM | POA: Diagnosis not present

## 2016-10-07 DIAGNOSIS — C7951 Secondary malignant neoplasm of bone: Secondary | ICD-10-CM | POA: Diagnosis not present

## 2016-10-07 DIAGNOSIS — N200 Calculus of kidney: Secondary | ICD-10-CM | POA: Diagnosis not present

## 2016-10-07 DIAGNOSIS — Z8659 Personal history of other mental and behavioral disorders: Secondary | ICD-10-CM | POA: Diagnosis not present

## 2016-10-07 DIAGNOSIS — Z9079 Acquired absence of other genital organ(s): Secondary | ICD-10-CM | POA: Diagnosis not present

## 2016-10-25 DIAGNOSIS — H353211 Exudative age-related macular degeneration, right eye, with active choroidal neovascularization: Secondary | ICD-10-CM | POA: Diagnosis not present

## 2016-11-07 DIAGNOSIS — Z0001 Encounter for general adult medical examination with abnormal findings: Secondary | ICD-10-CM | POA: Diagnosis not present

## 2016-11-10 DIAGNOSIS — Z0001 Encounter for general adult medical examination with abnormal findings: Secondary | ICD-10-CM | POA: Diagnosis not present

## 2016-11-10 DIAGNOSIS — Z6829 Body mass index (BMI) 29.0-29.9, adult: Secondary | ICD-10-CM | POA: Diagnosis not present

## 2016-11-16 DIAGNOSIS — C61 Malignant neoplasm of prostate: Secondary | ICD-10-CM | POA: Diagnosis not present

## 2016-11-16 DIAGNOSIS — Z923 Personal history of irradiation: Secondary | ICD-10-CM | POA: Diagnosis not present

## 2016-11-16 DIAGNOSIS — Z79899 Other long term (current) drug therapy: Secondary | ICD-10-CM | POA: Diagnosis not present

## 2016-11-16 DIAGNOSIS — F329 Major depressive disorder, single episode, unspecified: Secondary | ICD-10-CM | POA: Diagnosis not present

## 2016-11-16 DIAGNOSIS — Z87442 Personal history of urinary calculi: Secondary | ICD-10-CM | POA: Diagnosis not present

## 2016-11-16 DIAGNOSIS — Z9079 Acquired absence of other genital organ(s): Secondary | ICD-10-CM | POA: Diagnosis not present

## 2016-11-16 DIAGNOSIS — E785 Hyperlipidemia, unspecified: Secondary | ICD-10-CM | POA: Diagnosis not present

## 2016-11-16 DIAGNOSIS — I10 Essential (primary) hypertension: Secondary | ICD-10-CM | POA: Diagnosis not present

## 2016-11-29 DIAGNOSIS — H353211 Exudative age-related macular degeneration, right eye, with active choroidal neovascularization: Secondary | ICD-10-CM | POA: Diagnosis not present

## 2017-01-04 DIAGNOSIS — Z08 Encounter for follow-up examination after completed treatment for malignant neoplasm: Secondary | ICD-10-CM | POA: Diagnosis not present

## 2017-01-04 DIAGNOSIS — F329 Major depressive disorder, single episode, unspecified: Secondary | ICD-10-CM | POA: Diagnosis not present

## 2017-01-04 DIAGNOSIS — Z8546 Personal history of malignant neoplasm of prostate: Secondary | ICD-10-CM | POA: Diagnosis not present

## 2017-01-04 DIAGNOSIS — Z923 Personal history of irradiation: Secondary | ICD-10-CM | POA: Diagnosis not present

## 2017-01-04 DIAGNOSIS — C61 Malignant neoplasm of prostate: Secondary | ICD-10-CM | POA: Diagnosis not present

## 2017-01-04 DIAGNOSIS — E860 Dehydration: Secondary | ICD-10-CM | POA: Diagnosis not present

## 2017-01-04 DIAGNOSIS — Z9079 Acquired absence of other genital organ(s): Secondary | ICD-10-CM | POA: Diagnosis not present

## 2017-01-04 DIAGNOSIS — R0781 Pleurodynia: Secondary | ICD-10-CM | POA: Diagnosis not present

## 2017-01-11 DIAGNOSIS — H353211 Exudative age-related macular degeneration, right eye, with active choroidal neovascularization: Secondary | ICD-10-CM | POA: Diagnosis not present

## 2017-01-23 DIAGNOSIS — H35722 Serous detachment of retinal pigment epithelium, left eye: Secondary | ICD-10-CM | POA: Diagnosis not present

## 2017-01-23 DIAGNOSIS — H35363 Drusen (degenerative) of macula, bilateral: Secondary | ICD-10-CM | POA: Diagnosis not present

## 2017-01-23 DIAGNOSIS — H353211 Exudative age-related macular degeneration, right eye, with active choroidal neovascularization: Secondary | ICD-10-CM | POA: Diagnosis not present

## 2017-01-23 DIAGNOSIS — H25013 Cortical age-related cataract, bilateral: Secondary | ICD-10-CM | POA: Diagnosis not present

## 2017-02-15 DIAGNOSIS — Z08 Encounter for follow-up examination after completed treatment for malignant neoplasm: Secondary | ICD-10-CM | POA: Diagnosis not present

## 2017-02-15 DIAGNOSIS — Z9079 Acquired absence of other genital organ(s): Secondary | ICD-10-CM | POA: Diagnosis not present

## 2017-02-15 DIAGNOSIS — R0781 Pleurodynia: Secondary | ICD-10-CM | POA: Diagnosis not present

## 2017-02-15 DIAGNOSIS — Z79899 Other long term (current) drug therapy: Secondary | ICD-10-CM | POA: Diagnosis not present

## 2017-02-15 DIAGNOSIS — F329 Major depressive disorder, single episode, unspecified: Secondary | ICD-10-CM | POA: Diagnosis not present

## 2017-02-15 DIAGNOSIS — C7951 Secondary malignant neoplasm of bone: Secondary | ICD-10-CM | POA: Diagnosis not present

## 2017-02-15 DIAGNOSIS — M899 Disorder of bone, unspecified: Secondary | ICD-10-CM | POA: Diagnosis not present

## 2017-02-15 DIAGNOSIS — C61 Malignant neoplasm of prostate: Secondary | ICD-10-CM | POA: Diagnosis not present

## 2017-02-15 DIAGNOSIS — Z923 Personal history of irradiation: Secondary | ICD-10-CM | POA: Diagnosis not present

## 2017-02-15 DIAGNOSIS — Z8546 Personal history of malignant neoplasm of prostate: Secondary | ICD-10-CM | POA: Diagnosis not present

## 2017-02-21 DIAGNOSIS — H353211 Exudative age-related macular degeneration, right eye, with active choroidal neovascularization: Secondary | ICD-10-CM | POA: Diagnosis not present

## 2017-03-09 DIAGNOSIS — I10 Essential (primary) hypertension: Secondary | ICD-10-CM | POA: Diagnosis not present

## 2017-03-09 DIAGNOSIS — E782 Mixed hyperlipidemia: Secondary | ICD-10-CM | POA: Diagnosis not present

## 2017-03-09 DIAGNOSIS — K219 Gastro-esophageal reflux disease without esophagitis: Secondary | ICD-10-CM | POA: Diagnosis not present

## 2017-03-09 DIAGNOSIS — Z1212 Encounter for screening for malignant neoplasm of rectum: Secondary | ICD-10-CM | POA: Diagnosis not present

## 2017-03-27 DIAGNOSIS — H353211 Exudative age-related macular degeneration, right eye, with active choroidal neovascularization: Secondary | ICD-10-CM | POA: Diagnosis not present

## 2017-05-01 DIAGNOSIS — H353211 Exudative age-related macular degeneration, right eye, with active choroidal neovascularization: Secondary | ICD-10-CM | POA: Diagnosis not present

## 2017-05-17 DIAGNOSIS — C61 Malignant neoplasm of prostate: Secondary | ICD-10-CM | POA: Diagnosis not present

## 2017-05-17 DIAGNOSIS — F419 Anxiety disorder, unspecified: Secondary | ICD-10-CM | POA: Diagnosis not present

## 2017-05-17 DIAGNOSIS — Z79899 Other long term (current) drug therapy: Secondary | ICD-10-CM | POA: Diagnosis not present

## 2017-05-17 DIAGNOSIS — Z9079 Acquired absence of other genital organ(s): Secondary | ICD-10-CM | POA: Diagnosis not present

## 2017-05-17 DIAGNOSIS — F329 Major depressive disorder, single episode, unspecified: Secondary | ICD-10-CM | POA: Diagnosis not present

## 2017-05-17 DIAGNOSIS — R0781 Pleurodynia: Secondary | ICD-10-CM | POA: Diagnosis not present

## 2017-06-06 DIAGNOSIS — H353211 Exudative age-related macular degeneration, right eye, with active choroidal neovascularization: Secondary | ICD-10-CM | POA: Diagnosis not present

## 2017-07-11 DIAGNOSIS — H353211 Exudative age-related macular degeneration, right eye, with active choroidal neovascularization: Secondary | ICD-10-CM | POA: Diagnosis not present

## 2017-07-26 DIAGNOSIS — M5414 Radiculopathy, thoracic region: Secondary | ICD-10-CM | POA: Diagnosis not present

## 2017-07-26 DIAGNOSIS — M47814 Spondylosis without myelopathy or radiculopathy, thoracic region: Secondary | ICD-10-CM | POA: Diagnosis not present

## 2017-07-26 DIAGNOSIS — Z6827 Body mass index (BMI) 27.0-27.9, adult: Secondary | ICD-10-CM | POA: Diagnosis not present

## 2017-08-03 DIAGNOSIS — M5414 Radiculopathy, thoracic region: Secondary | ICD-10-CM | POA: Diagnosis not present

## 2017-08-03 DIAGNOSIS — M5134 Other intervertebral disc degeneration, thoracic region: Secondary | ICD-10-CM | POA: Diagnosis not present

## 2017-08-03 DIAGNOSIS — M47814 Spondylosis without myelopathy or radiculopathy, thoracic region: Secondary | ICD-10-CM | POA: Diagnosis not present

## 2017-08-03 DIAGNOSIS — N200 Calculus of kidney: Secondary | ICD-10-CM | POA: Diagnosis not present

## 2017-08-16 DIAGNOSIS — H353211 Exudative age-related macular degeneration, right eye, with active choroidal neovascularization: Secondary | ICD-10-CM | POA: Diagnosis not present

## 2017-08-31 DIAGNOSIS — M5414 Radiculopathy, thoracic region: Secondary | ICD-10-CM | POA: Diagnosis not present

## 2017-08-31 DIAGNOSIS — Z6827 Body mass index (BMI) 27.0-27.9, adult: Secondary | ICD-10-CM | POA: Diagnosis not present

## 2017-09-02 IMAGING — DX DG CHEST 2V
2 series · 2 of 2 positions shown · non-contrast
Comparison: None in PACs

CLINICAL DATA: Preoperative examination prior to surgery for
prostate malignancy ; the patient reports new onset of cough and
fever

EXAM:
CHEST  2 VIEW

[chest pa]
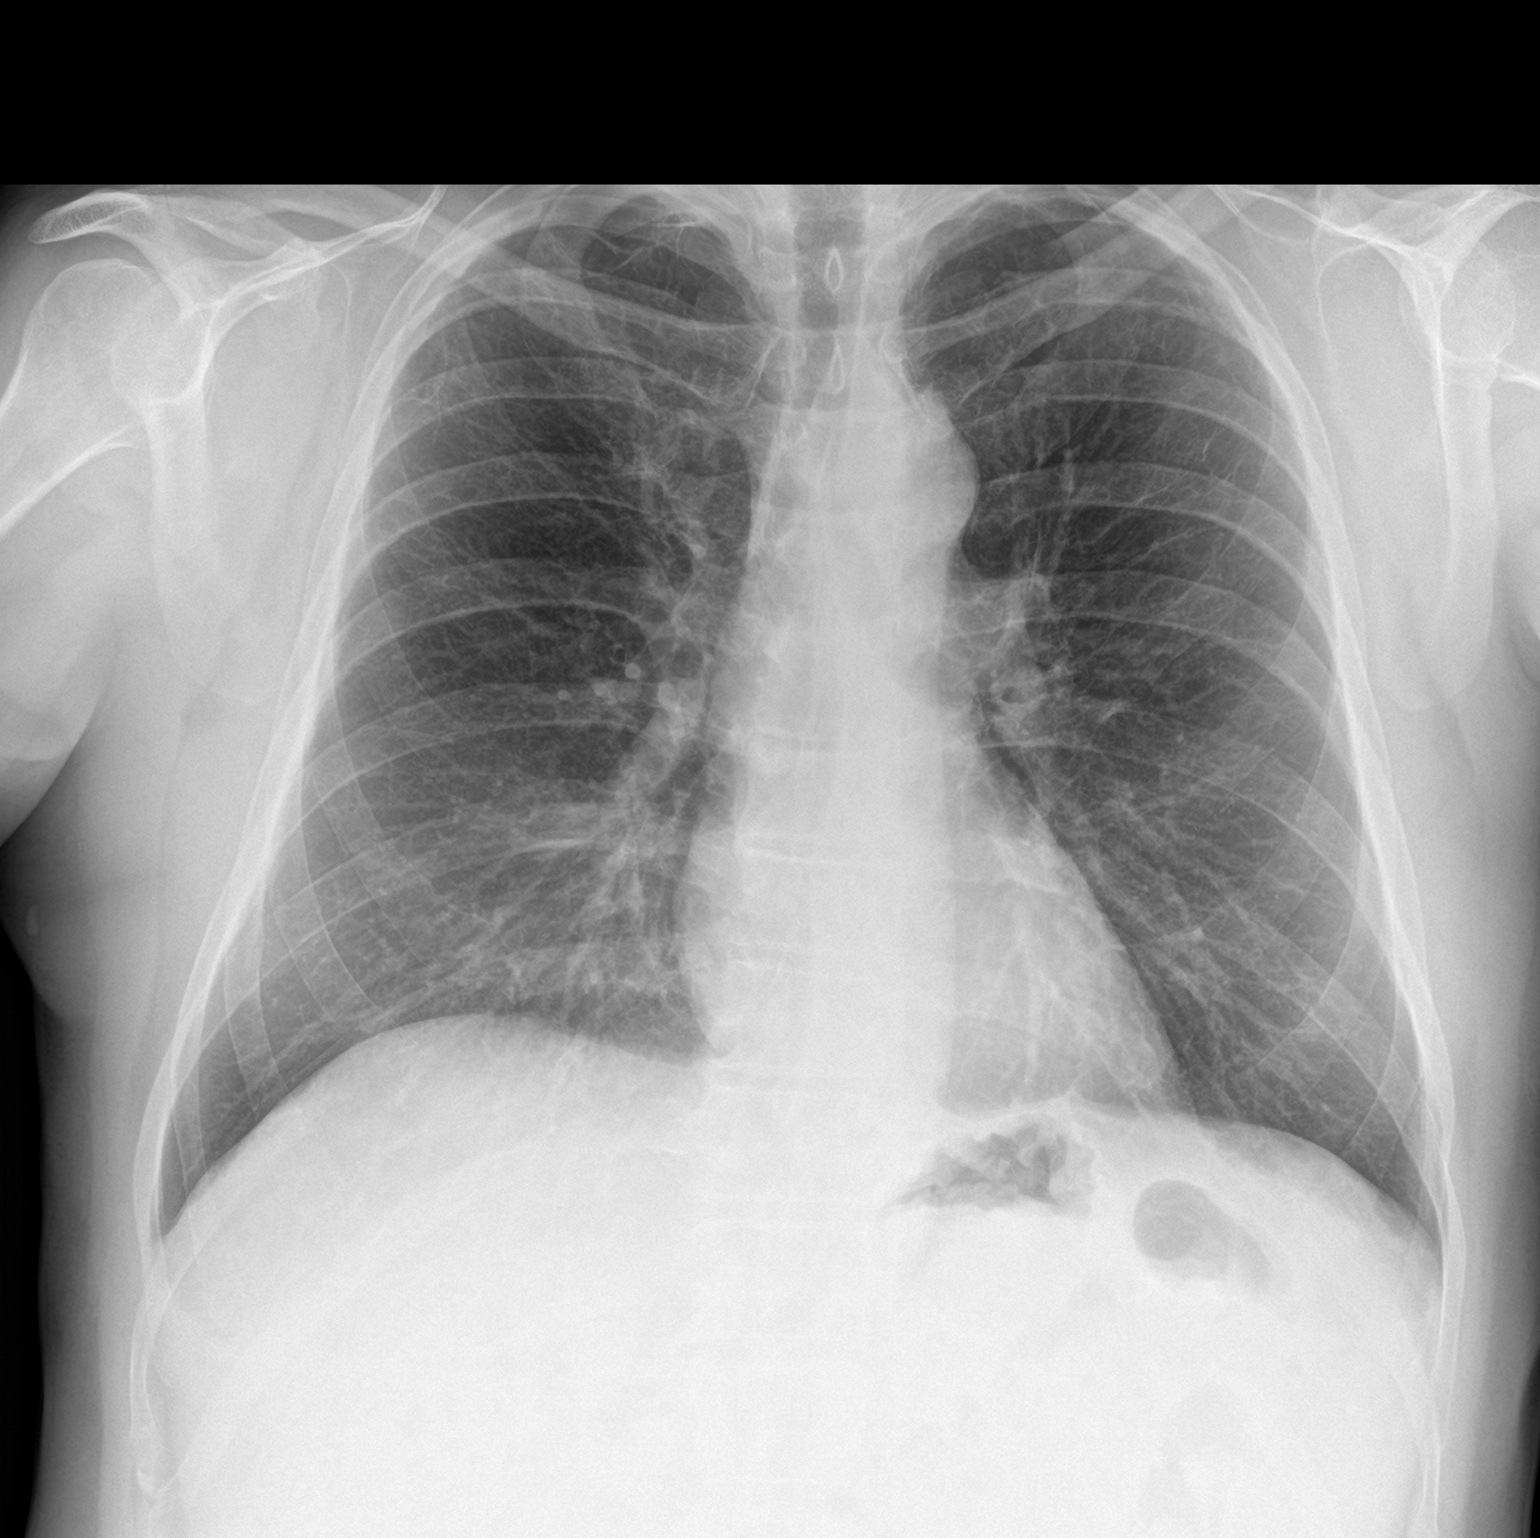

[chest lat]
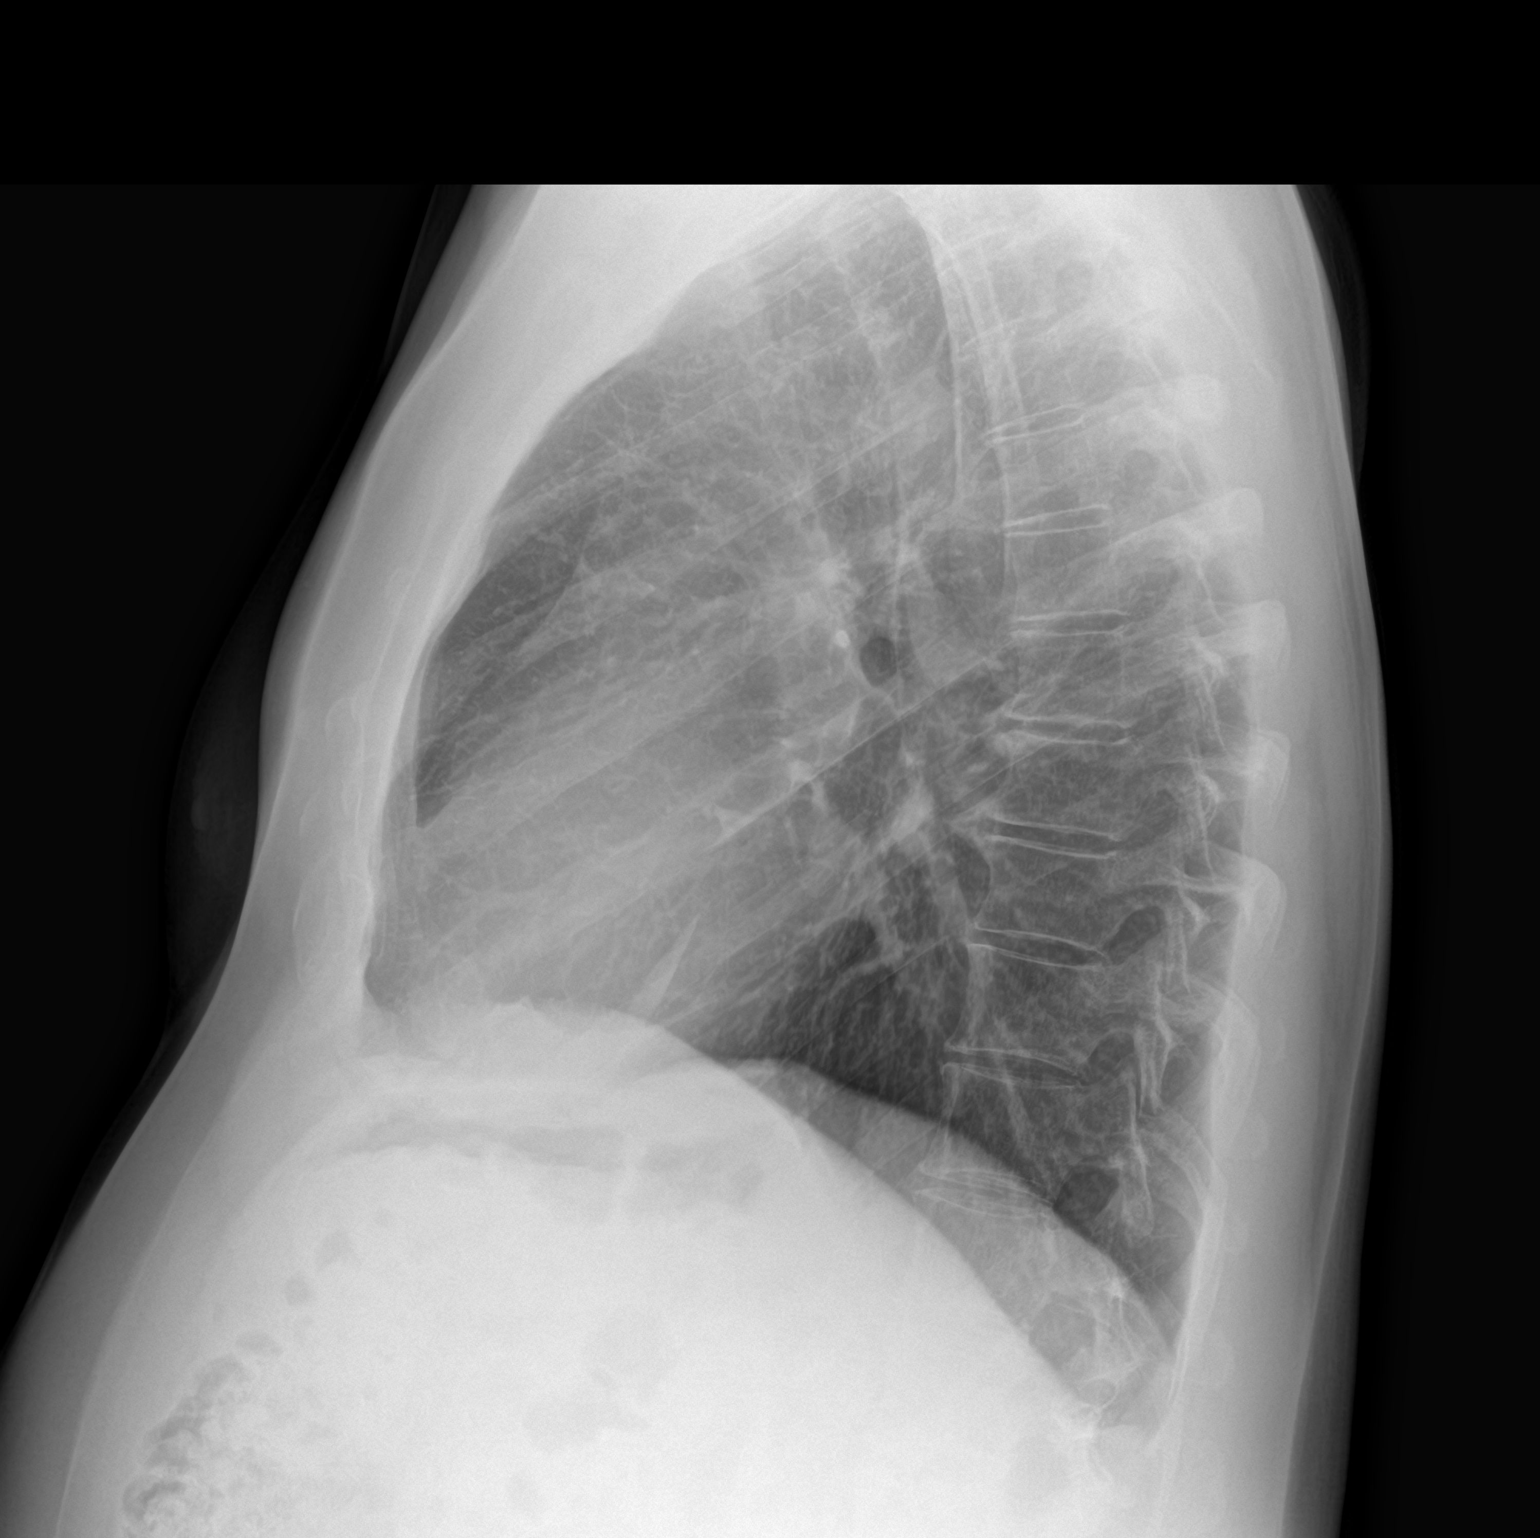

[2 of 2 positions shown; findings below may reference images not displayed]

FINDINGS: The lungs are adequately inflated. There is no focal infiltrate.
There are bullous lesions in the right pulmonary apex. There is no
pleural effusion. The heart and pulmonary vascularity are normal.
The mediastinum is normal in width. The observed bony thorax is
unremarkable.
IMPRESSION: Bullous emphysematous changes in the apices greatest on the right.
There is no active cardiopulmonary disease.

## 2017-09-13 DIAGNOSIS — C61 Malignant neoplasm of prostate: Secondary | ICD-10-CM | POA: Diagnosis not present

## 2017-09-13 DIAGNOSIS — Z87891 Personal history of nicotine dependence: Secondary | ICD-10-CM | POA: Diagnosis not present

## 2017-09-13 DIAGNOSIS — C7951 Secondary malignant neoplasm of bone: Secondary | ICD-10-CM | POA: Diagnosis not present

## 2017-09-13 DIAGNOSIS — Z9079 Acquired absence of other genital organ(s): Secondary | ICD-10-CM | POA: Diagnosis not present

## 2017-09-13 DIAGNOSIS — Z923 Personal history of irradiation: Secondary | ICD-10-CM | POA: Diagnosis not present

## 2017-09-13 DIAGNOSIS — F418 Other specified anxiety disorders: Secondary | ICD-10-CM | POA: Diagnosis not present

## 2017-09-22 DIAGNOSIS — H353211 Exudative age-related macular degeneration, right eye, with active choroidal neovascularization: Secondary | ICD-10-CM | POA: Diagnosis not present

## 2017-10-12 DIAGNOSIS — I10 Essential (primary) hypertension: Secondary | ICD-10-CM | POA: Diagnosis not present

## 2017-10-12 DIAGNOSIS — Z79899 Other long term (current) drug therapy: Secondary | ICD-10-CM | POA: Diagnosis not present

## 2017-10-12 DIAGNOSIS — Z87891 Personal history of nicotine dependence: Secondary | ICD-10-CM | POA: Diagnosis not present

## 2017-10-12 DIAGNOSIS — S51811A Laceration without foreign body of right forearm, initial encounter: Secondary | ICD-10-CM | POA: Diagnosis not present

## 2017-10-30 DIAGNOSIS — H353211 Exudative age-related macular degeneration, right eye, with active choroidal neovascularization: Secondary | ICD-10-CM | POA: Diagnosis not present

## 2017-11-15 DIAGNOSIS — Z6826 Body mass index (BMI) 26.0-26.9, adult: Secondary | ICD-10-CM | POA: Diagnosis not present

## 2017-11-15 DIAGNOSIS — E782 Mixed hyperlipidemia: Secondary | ICD-10-CM | POA: Diagnosis not present

## 2017-11-15 DIAGNOSIS — K219 Gastro-esophageal reflux disease without esophagitis: Secondary | ICD-10-CM | POA: Diagnosis not present

## 2017-11-15 DIAGNOSIS — C61 Malignant neoplasm of prostate: Secondary | ICD-10-CM | POA: Diagnosis not present

## 2017-11-15 DIAGNOSIS — E6609 Other obesity due to excess calories: Secondary | ICD-10-CM | POA: Diagnosis not present

## 2017-11-15 DIAGNOSIS — Z0001 Encounter for general adult medical examination with abnormal findings: Secondary | ICD-10-CM | POA: Diagnosis not present

## 2017-11-15 DIAGNOSIS — J301 Allergic rhinitis due to pollen: Secondary | ICD-10-CM | POA: Diagnosis not present

## 2017-11-15 DIAGNOSIS — I10 Essential (primary) hypertension: Secondary | ICD-10-CM | POA: Diagnosis not present

## 2017-12-01 DIAGNOSIS — R45851 Suicidal ideations: Secondary | ICD-10-CM | POA: Diagnosis not present

## 2017-12-01 DIAGNOSIS — F329 Major depressive disorder, single episode, unspecified: Secondary | ICD-10-CM | POA: Diagnosis not present

## 2017-12-01 DIAGNOSIS — C61 Malignant neoplasm of prostate: Secondary | ICD-10-CM | POA: Diagnosis not present

## 2017-12-01 DIAGNOSIS — Z9079 Acquired absence of other genital organ(s): Secondary | ICD-10-CM | POA: Diagnosis not present

## 2017-12-01 DIAGNOSIS — F419 Anxiety disorder, unspecified: Secondary | ICD-10-CM | POA: Diagnosis not present

## 2017-12-04 DIAGNOSIS — H353211 Exudative age-related macular degeneration, right eye, with active choroidal neovascularization: Secondary | ICD-10-CM | POA: Diagnosis not present

## 2018-01-11 DIAGNOSIS — H353211 Exudative age-related macular degeneration, right eye, with active choroidal neovascularization: Secondary | ICD-10-CM | POA: Diagnosis not present

## 2018-02-13 DIAGNOSIS — H353211 Exudative age-related macular degeneration, right eye, with active choroidal neovascularization: Secondary | ICD-10-CM | POA: Diagnosis not present

## 2018-02-28 DIAGNOSIS — M549 Dorsalgia, unspecified: Secondary | ICD-10-CM | POA: Diagnosis not present

## 2018-02-28 DIAGNOSIS — C7951 Secondary malignant neoplasm of bone: Secondary | ICD-10-CM | POA: Diagnosis not present

## 2018-02-28 DIAGNOSIS — F329 Major depressive disorder, single episode, unspecified: Secondary | ICD-10-CM | POA: Diagnosis not present

## 2018-02-28 DIAGNOSIS — C61 Malignant neoplasm of prostate: Secondary | ICD-10-CM | POA: Diagnosis not present

## 2018-02-28 DIAGNOSIS — Z79818 Long term (current) use of other agents affecting estrogen receptors and estrogen levels: Secondary | ICD-10-CM | POA: Diagnosis not present

## 2018-02-28 DIAGNOSIS — Z191 Hormone sensitive malignancy status: Secondary | ICD-10-CM | POA: Diagnosis not present

## 2018-02-28 DIAGNOSIS — F419 Anxiety disorder, unspecified: Secondary | ICD-10-CM | POA: Diagnosis not present

## 2018-03-20 DIAGNOSIS — H353211 Exudative age-related macular degeneration, right eye, with active choroidal neovascularization: Secondary | ICD-10-CM | POA: Diagnosis not present

## 2018-04-24 DIAGNOSIS — H353211 Exudative age-related macular degeneration, right eye, with active choroidal neovascularization: Secondary | ICD-10-CM | POA: Diagnosis not present

## 2018-05-23 DIAGNOSIS — I10 Essential (primary) hypertension: Secondary | ICD-10-CM | POA: Diagnosis not present

## 2018-05-23 DIAGNOSIS — C61 Malignant neoplasm of prostate: Secondary | ICD-10-CM | POA: Diagnosis not present

## 2018-05-23 DIAGNOSIS — C7951 Secondary malignant neoplasm of bone: Secondary | ICD-10-CM | POA: Diagnosis not present

## 2018-05-23 DIAGNOSIS — M791 Myalgia, unspecified site: Secondary | ICD-10-CM | POA: Diagnosis not present

## 2018-05-23 DIAGNOSIS — Z9079 Acquired absence of other genital organ(s): Secondary | ICD-10-CM | POA: Diagnosis not present

## 2018-05-23 DIAGNOSIS — E785 Hyperlipidemia, unspecified: Secondary | ICD-10-CM | POA: Diagnosis not present

## 2018-05-23 DIAGNOSIS — F418 Other specified anxiety disorders: Secondary | ICD-10-CM | POA: Diagnosis not present

## 2018-05-23 DIAGNOSIS — M549 Dorsalgia, unspecified: Secondary | ICD-10-CM | POA: Diagnosis not present

## 2018-05-23 DIAGNOSIS — Z79899 Other long term (current) drug therapy: Secondary | ICD-10-CM | POA: Diagnosis not present

## 2018-05-23 DIAGNOSIS — R5383 Other fatigue: Secondary | ICD-10-CM | POA: Diagnosis not present

## 2018-05-23 DIAGNOSIS — R6889 Other general symptoms and signs: Secondary | ICD-10-CM | POA: Diagnosis not present

## 2018-05-23 DIAGNOSIS — Z192 Hormone resistant malignancy status: Secondary | ICD-10-CM | POA: Diagnosis not present

## 2018-05-29 DIAGNOSIS — H353211 Exudative age-related macular degeneration, right eye, with active choroidal neovascularization: Secondary | ICD-10-CM | POA: Diagnosis not present

## 2018-07-05 DIAGNOSIS — H353211 Exudative age-related macular degeneration, right eye, with active choroidal neovascularization: Secondary | ICD-10-CM | POA: Diagnosis not present

## 2018-07-31 DIAGNOSIS — I10 Essential (primary) hypertension: Secondary | ICD-10-CM | POA: Diagnosis not present

## 2018-07-31 DIAGNOSIS — Z8546 Personal history of malignant neoplasm of prostate: Secondary | ICD-10-CM | POA: Diagnosis not present

## 2018-07-31 DIAGNOSIS — R1110 Vomiting, unspecified: Secondary | ICD-10-CM | POA: Diagnosis not present

## 2018-07-31 DIAGNOSIS — E785 Hyperlipidemia, unspecified: Secondary | ICD-10-CM | POA: Diagnosis not present

## 2018-07-31 DIAGNOSIS — N200 Calculus of kidney: Secondary | ICD-10-CM | POA: Diagnosis not present

## 2018-07-31 DIAGNOSIS — Z85830 Personal history of malignant neoplasm of bone: Secondary | ICD-10-CM | POA: Diagnosis not present

## 2018-07-31 DIAGNOSIS — Z87891 Personal history of nicotine dependence: Secondary | ICD-10-CM | POA: Diagnosis not present

## 2018-07-31 DIAGNOSIS — Z87442 Personal history of urinary calculi: Secondary | ICD-10-CM | POA: Diagnosis not present

## 2018-07-31 DIAGNOSIS — Z79899 Other long term (current) drug therapy: Secondary | ICD-10-CM | POA: Diagnosis not present

## 2018-07-31 DIAGNOSIS — K219 Gastro-esophageal reflux disease without esophagitis: Secondary | ICD-10-CM | POA: Diagnosis not present

## 2018-07-31 DIAGNOSIS — R109 Unspecified abdominal pain: Secondary | ICD-10-CM | POA: Diagnosis not present

## 2018-08-09 DIAGNOSIS — H353211 Exudative age-related macular degeneration, right eye, with active choroidal neovascularization: Secondary | ICD-10-CM | POA: Diagnosis not present

## 2018-08-15 DIAGNOSIS — N393 Stress incontinence (female) (male): Secondary | ICD-10-CM | POA: Diagnosis not present

## 2018-08-15 DIAGNOSIS — C61 Malignant neoplasm of prostate: Secondary | ICD-10-CM | POA: Diagnosis not present

## 2018-08-15 DIAGNOSIS — N200 Calculus of kidney: Secondary | ICD-10-CM | POA: Diagnosis not present

## 2018-08-22 DIAGNOSIS — Z79818 Long term (current) use of other agents affecting estrogen receptors and estrogen levels: Secondary | ICD-10-CM | POA: Diagnosis not present

## 2018-08-22 DIAGNOSIS — F418 Other specified anxiety disorders: Secondary | ICD-10-CM | POA: Diagnosis not present

## 2018-08-22 DIAGNOSIS — M549 Dorsalgia, unspecified: Secondary | ICD-10-CM | POA: Diagnosis not present

## 2018-08-22 DIAGNOSIS — C61 Malignant neoplasm of prostate: Secondary | ICD-10-CM | POA: Diagnosis not present

## 2018-08-22 DIAGNOSIS — Z191 Hormone sensitive malignancy status: Secondary | ICD-10-CM | POA: Diagnosis not present

## 2018-08-22 DIAGNOSIS — C7951 Secondary malignant neoplasm of bone: Secondary | ICD-10-CM | POA: Diagnosis not present

## 2018-09-19 DIAGNOSIS — H353211 Exudative age-related macular degeneration, right eye, with active choroidal neovascularization: Secondary | ICD-10-CM | POA: Diagnosis not present

## 2018-10-22 DIAGNOSIS — M940 Chondrocostal junction syndrome [Tietze]: Secondary | ICD-10-CM | POA: Diagnosis not present

## 2018-10-22 DIAGNOSIS — Z6829 Body mass index (BMI) 29.0-29.9, adult: Secondary | ICD-10-CM | POA: Diagnosis not present

## 2018-10-23 DIAGNOSIS — H353211 Exudative age-related macular degeneration, right eye, with active choroidal neovascularization: Secondary | ICD-10-CM | POA: Diagnosis not present

## 2018-11-21 DIAGNOSIS — M533 Sacrococcygeal disorders, not elsewhere classified: Secondary | ICD-10-CM | POA: Diagnosis not present

## 2018-11-21 DIAGNOSIS — C7951 Secondary malignant neoplasm of bone: Secondary | ICD-10-CM | POA: Diagnosis not present

## 2018-11-21 DIAGNOSIS — F329 Major depressive disorder, single episode, unspecified: Secondary | ICD-10-CM | POA: Diagnosis not present

## 2018-11-21 DIAGNOSIS — Z192 Hormone resistant malignancy status: Secondary | ICD-10-CM | POA: Diagnosis not present

## 2018-11-21 DIAGNOSIS — F419 Anxiety disorder, unspecified: Secondary | ICD-10-CM | POA: Diagnosis not present

## 2018-11-21 DIAGNOSIS — M549 Dorsalgia, unspecified: Secondary | ICD-10-CM | POA: Diagnosis not present

## 2018-11-21 DIAGNOSIS — Z79818 Long term (current) use of other agents affecting estrogen receptors and estrogen levels: Secondary | ICD-10-CM | POA: Diagnosis not present

## 2018-11-21 DIAGNOSIS — R0781 Pleurodynia: Secondary | ICD-10-CM | POA: Diagnosis not present

## 2018-11-21 DIAGNOSIS — C61 Malignant neoplasm of prostate: Secondary | ICD-10-CM | POA: Diagnosis not present

## 2018-11-27 DIAGNOSIS — H353211 Exudative age-related macular degeneration, right eye, with active choroidal neovascularization: Secondary | ICD-10-CM | POA: Diagnosis not present

## 2018-12-11 DIAGNOSIS — C7951 Secondary malignant neoplasm of bone: Secondary | ICD-10-CM | POA: Diagnosis not present

## 2018-12-11 DIAGNOSIS — M898X8 Other specified disorders of bone, other site: Secondary | ICD-10-CM | POA: Diagnosis not present

## 2018-12-11 DIAGNOSIS — K5730 Diverticulosis of large intestine without perforation or abscess without bleeding: Secondary | ICD-10-CM | POA: Diagnosis not present

## 2018-12-11 DIAGNOSIS — I700 Atherosclerosis of aorta: Secondary | ICD-10-CM | POA: Diagnosis not present

## 2018-12-11 DIAGNOSIS — C61 Malignant neoplasm of prostate: Secondary | ICD-10-CM | POA: Diagnosis not present

## 2018-12-11 DIAGNOSIS — J432 Centrilobular emphysema: Secondary | ICD-10-CM | POA: Diagnosis not present

## 2018-12-11 DIAGNOSIS — J9811 Atelectasis: Secondary | ICD-10-CM | POA: Diagnosis not present

## 2018-12-11 DIAGNOSIS — J439 Emphysema, unspecified: Secondary | ICD-10-CM | POA: Diagnosis not present

## 2018-12-11 DIAGNOSIS — K449 Diaphragmatic hernia without obstruction or gangrene: Secondary | ICD-10-CM | POA: Diagnosis not present

## 2018-12-11 DIAGNOSIS — N200 Calculus of kidney: Secondary | ICD-10-CM | POA: Diagnosis not present

## 2018-12-12 DIAGNOSIS — C7951 Secondary malignant neoplasm of bone: Secondary | ICD-10-CM | POA: Diagnosis not present

## 2018-12-12 DIAGNOSIS — Z5111 Encounter for antineoplastic chemotherapy: Secondary | ICD-10-CM | POA: Diagnosis not present

## 2018-12-12 DIAGNOSIS — C61 Malignant neoplasm of prostate: Secondary | ICD-10-CM | POA: Diagnosis not present

## 2019-01-04 DIAGNOSIS — H353211 Exudative age-related macular degeneration, right eye, with active choroidal neovascularization: Secondary | ICD-10-CM | POA: Diagnosis not present

## 2019-01-09 DIAGNOSIS — E785 Hyperlipidemia, unspecified: Secondary | ICD-10-CM | POA: Diagnosis not present

## 2019-01-09 DIAGNOSIS — F418 Other specified anxiety disorders: Secondary | ICD-10-CM | POA: Diagnosis not present

## 2019-01-09 DIAGNOSIS — C7951 Secondary malignant neoplasm of bone: Secondary | ICD-10-CM | POA: Diagnosis not present

## 2019-01-09 DIAGNOSIS — Z192 Hormone resistant malignancy status: Secondary | ICD-10-CM | POA: Diagnosis not present

## 2019-01-09 DIAGNOSIS — C61 Malignant neoplasm of prostate: Secondary | ICD-10-CM | POA: Diagnosis not present

## 2019-01-15 DIAGNOSIS — Z6830 Body mass index (BMI) 30.0-30.9, adult: Secondary | ICD-10-CM | POA: Diagnosis not present

## 2019-01-15 DIAGNOSIS — Z923 Personal history of irradiation: Secondary | ICD-10-CM | POA: Diagnosis not present

## 2019-01-15 DIAGNOSIS — C61 Malignant neoplasm of prostate: Secondary | ICD-10-CM | POA: Diagnosis not present

## 2019-01-15 DIAGNOSIS — Z791 Long term (current) use of non-steroidal anti-inflammatories (NSAID): Secondary | ICD-10-CM | POA: Diagnosis not present

## 2019-01-15 DIAGNOSIS — Z9079 Acquired absence of other genital organ(s): Secondary | ICD-10-CM | POA: Diagnosis not present

## 2019-01-15 DIAGNOSIS — Z510 Encounter for antineoplastic radiation therapy: Secondary | ICD-10-CM | POA: Diagnosis not present

## 2019-01-15 DIAGNOSIS — Z809 Family history of malignant neoplasm, unspecified: Secondary | ICD-10-CM | POA: Diagnosis not present

## 2019-01-15 DIAGNOSIS — C7951 Secondary malignant neoplasm of bone: Secondary | ICD-10-CM | POA: Diagnosis not present

## 2019-01-15 DIAGNOSIS — Z79899 Other long term (current) drug therapy: Secondary | ICD-10-CM | POA: Diagnosis not present

## 2019-01-15 DIAGNOSIS — Z87891 Personal history of nicotine dependence: Secondary | ICD-10-CM | POA: Diagnosis not present

## 2019-01-15 DIAGNOSIS — Z9225 Personal history of immunosupression therapy: Secondary | ICD-10-CM | POA: Diagnosis not present

## 2019-01-15 DIAGNOSIS — Z192 Hormone resistant malignancy status: Secondary | ICD-10-CM | POA: Diagnosis not present

## 2019-01-15 DIAGNOSIS — Z7952 Long term (current) use of systemic steroids: Secondary | ICD-10-CM | POA: Diagnosis not present

## 2019-01-16 ENCOUNTER — Telehealth: Payer: Self-pay | Admitting: *Deleted

## 2019-01-16 DIAGNOSIS — Z9079 Acquired absence of other genital organ(s): Secondary | ICD-10-CM | POA: Diagnosis not present

## 2019-01-16 DIAGNOSIS — Z809 Family history of malignant neoplasm, unspecified: Secondary | ICD-10-CM | POA: Diagnosis not present

## 2019-01-16 DIAGNOSIS — Z923 Personal history of irradiation: Secondary | ICD-10-CM | POA: Diagnosis not present

## 2019-01-16 DIAGNOSIS — Z79899 Other long term (current) drug therapy: Secondary | ICD-10-CM | POA: Diagnosis not present

## 2019-01-16 DIAGNOSIS — Z791 Long term (current) use of non-steroidal anti-inflammatories (NSAID): Secondary | ICD-10-CM | POA: Diagnosis not present

## 2019-01-16 DIAGNOSIS — Z7952 Long term (current) use of systemic steroids: Secondary | ICD-10-CM | POA: Diagnosis not present

## 2019-01-16 DIAGNOSIS — Z9225 Personal history of immunosupression therapy: Secondary | ICD-10-CM | POA: Diagnosis not present

## 2019-01-16 DIAGNOSIS — Z87891 Personal history of nicotine dependence: Secondary | ICD-10-CM | POA: Diagnosis not present

## 2019-01-16 DIAGNOSIS — C7951 Secondary malignant neoplasm of bone: Secondary | ICD-10-CM | POA: Diagnosis not present

## 2019-01-16 DIAGNOSIS — C61 Malignant neoplasm of prostate: Secondary | ICD-10-CM | POA: Diagnosis not present

## 2019-01-16 DIAGNOSIS — Z192 Hormone resistant malignancy status: Secondary | ICD-10-CM | POA: Diagnosis not present

## 2019-01-16 DIAGNOSIS — Z510 Encounter for antineoplastic radiation therapy: Secondary | ICD-10-CM | POA: Diagnosis not present

## 2019-01-16 NOTE — Telephone Encounter (Signed)
On 01-16-19 fax medical records to [facility] in [place] , it was notes from Dr Tammi Klippel

## 2019-01-21 DIAGNOSIS — Z9079 Acquired absence of other genital organ(s): Secondary | ICD-10-CM | POA: Diagnosis not present

## 2019-01-21 DIAGNOSIS — C7951 Secondary malignant neoplasm of bone: Secondary | ICD-10-CM | POA: Diagnosis not present

## 2019-01-21 DIAGNOSIS — Z9225 Personal history of immunosupression therapy: Secondary | ICD-10-CM | POA: Diagnosis not present

## 2019-01-21 DIAGNOSIS — Z87891 Personal history of nicotine dependence: Secondary | ICD-10-CM | POA: Diagnosis not present

## 2019-01-21 DIAGNOSIS — Z7952 Long term (current) use of systemic steroids: Secondary | ICD-10-CM | POA: Diagnosis not present

## 2019-01-21 DIAGNOSIS — Z192 Hormone resistant malignancy status: Secondary | ICD-10-CM | POA: Diagnosis not present

## 2019-01-21 DIAGNOSIS — Z923 Personal history of irradiation: Secondary | ICD-10-CM | POA: Diagnosis not present

## 2019-01-21 DIAGNOSIS — Z791 Long term (current) use of non-steroidal anti-inflammatories (NSAID): Secondary | ICD-10-CM | POA: Diagnosis not present

## 2019-01-21 DIAGNOSIS — C61 Malignant neoplasm of prostate: Secondary | ICD-10-CM | POA: Diagnosis not present

## 2019-01-21 DIAGNOSIS — Z510 Encounter for antineoplastic radiation therapy: Secondary | ICD-10-CM | POA: Diagnosis not present

## 2019-01-21 DIAGNOSIS — Z79899 Other long term (current) drug therapy: Secondary | ICD-10-CM | POA: Diagnosis not present

## 2019-01-21 DIAGNOSIS — Z809 Family history of malignant neoplasm, unspecified: Secondary | ICD-10-CM | POA: Diagnosis not present

## 2019-01-24 DIAGNOSIS — Z923 Personal history of irradiation: Secondary | ICD-10-CM | POA: Diagnosis not present

## 2019-01-24 DIAGNOSIS — Z192 Hormone resistant malignancy status: Secondary | ICD-10-CM | POA: Diagnosis not present

## 2019-01-24 DIAGNOSIS — Z791 Long term (current) use of non-steroidal anti-inflammatories (NSAID): Secondary | ICD-10-CM | POA: Diagnosis not present

## 2019-01-24 DIAGNOSIS — Z9225 Personal history of immunosupression therapy: Secondary | ICD-10-CM | POA: Diagnosis not present

## 2019-01-24 DIAGNOSIS — Z79899 Other long term (current) drug therapy: Secondary | ICD-10-CM | POA: Diagnosis not present

## 2019-01-24 DIAGNOSIS — Z809 Family history of malignant neoplasm, unspecified: Secondary | ICD-10-CM | POA: Diagnosis not present

## 2019-01-24 DIAGNOSIS — Z7952 Long term (current) use of systemic steroids: Secondary | ICD-10-CM | POA: Diagnosis not present

## 2019-01-24 DIAGNOSIS — Z510 Encounter for antineoplastic radiation therapy: Secondary | ICD-10-CM | POA: Diagnosis not present

## 2019-01-24 DIAGNOSIS — Z9079 Acquired absence of other genital organ(s): Secondary | ICD-10-CM | POA: Diagnosis not present

## 2019-01-24 DIAGNOSIS — C61 Malignant neoplasm of prostate: Secondary | ICD-10-CM | POA: Diagnosis not present

## 2019-01-24 DIAGNOSIS — Z87891 Personal history of nicotine dependence: Secondary | ICD-10-CM | POA: Diagnosis not present

## 2019-01-24 DIAGNOSIS — C7951 Secondary malignant neoplasm of bone: Secondary | ICD-10-CM | POA: Diagnosis not present

## 2019-01-25 DIAGNOSIS — Z87891 Personal history of nicotine dependence: Secondary | ICD-10-CM | POA: Diagnosis not present

## 2019-01-25 DIAGNOSIS — Z9079 Acquired absence of other genital organ(s): Secondary | ICD-10-CM | POA: Diagnosis not present

## 2019-01-25 DIAGNOSIS — Z192 Hormone resistant malignancy status: Secondary | ICD-10-CM | POA: Diagnosis not present

## 2019-01-25 DIAGNOSIS — Z7952 Long term (current) use of systemic steroids: Secondary | ICD-10-CM | POA: Diagnosis not present

## 2019-01-25 DIAGNOSIS — Z809 Family history of malignant neoplasm, unspecified: Secondary | ICD-10-CM | POA: Diagnosis not present

## 2019-01-25 DIAGNOSIS — Z9225 Personal history of immunosupression therapy: Secondary | ICD-10-CM | POA: Diagnosis not present

## 2019-01-25 DIAGNOSIS — Z791 Long term (current) use of non-steroidal anti-inflammatories (NSAID): Secondary | ICD-10-CM | POA: Diagnosis not present

## 2019-01-25 DIAGNOSIS — C61 Malignant neoplasm of prostate: Secondary | ICD-10-CM | POA: Diagnosis not present

## 2019-01-25 DIAGNOSIS — Z923 Personal history of irradiation: Secondary | ICD-10-CM | POA: Diagnosis not present

## 2019-01-25 DIAGNOSIS — C7951 Secondary malignant neoplasm of bone: Secondary | ICD-10-CM | POA: Diagnosis not present

## 2019-01-25 DIAGNOSIS — Z510 Encounter for antineoplastic radiation therapy: Secondary | ICD-10-CM | POA: Diagnosis not present

## 2019-01-25 DIAGNOSIS — Z79899 Other long term (current) drug therapy: Secondary | ICD-10-CM | POA: Diagnosis not present

## 2019-01-28 DIAGNOSIS — Z192 Hormone resistant malignancy status: Secondary | ICD-10-CM | POA: Diagnosis not present

## 2019-01-28 DIAGNOSIS — I10 Essential (primary) hypertension: Secondary | ICD-10-CM | POA: Diagnosis not present

## 2019-01-28 DIAGNOSIS — Z79899 Other long term (current) drug therapy: Secondary | ICD-10-CM | POA: Diagnosis not present

## 2019-01-28 DIAGNOSIS — C7951 Secondary malignant neoplasm of bone: Secondary | ICD-10-CM | POA: Diagnosis not present

## 2019-01-28 DIAGNOSIS — Z809 Family history of malignant neoplasm, unspecified: Secondary | ICD-10-CM | POA: Diagnosis not present

## 2019-01-28 DIAGNOSIS — Z7952 Long term (current) use of systemic steroids: Secondary | ICD-10-CM | POA: Diagnosis not present

## 2019-01-28 DIAGNOSIS — Z87891 Personal history of nicotine dependence: Secondary | ICD-10-CM | POA: Diagnosis not present

## 2019-01-28 DIAGNOSIS — Z9225 Personal history of immunosupression therapy: Secondary | ICD-10-CM | POA: Diagnosis not present

## 2019-01-28 DIAGNOSIS — Z510 Encounter for antineoplastic radiation therapy: Secondary | ICD-10-CM | POA: Diagnosis not present

## 2019-01-28 DIAGNOSIS — Z923 Personal history of irradiation: Secondary | ICD-10-CM | POA: Diagnosis not present

## 2019-01-28 DIAGNOSIS — Z9079 Acquired absence of other genital organ(s): Secondary | ICD-10-CM | POA: Diagnosis not present

## 2019-01-28 DIAGNOSIS — C61 Malignant neoplasm of prostate: Secondary | ICD-10-CM | POA: Diagnosis not present

## 2019-01-28 DIAGNOSIS — Z791 Long term (current) use of non-steroidal anti-inflammatories (NSAID): Secondary | ICD-10-CM | POA: Diagnosis not present

## 2019-01-29 DIAGNOSIS — Z87891 Personal history of nicotine dependence: Secondary | ICD-10-CM | POA: Diagnosis not present

## 2019-01-29 DIAGNOSIS — Z510 Encounter for antineoplastic radiation therapy: Secondary | ICD-10-CM | POA: Diagnosis not present

## 2019-01-29 DIAGNOSIS — C7951 Secondary malignant neoplasm of bone: Secondary | ICD-10-CM | POA: Diagnosis not present

## 2019-01-29 DIAGNOSIS — C61 Malignant neoplasm of prostate: Secondary | ICD-10-CM | POA: Diagnosis not present

## 2019-01-29 DIAGNOSIS — Z79899 Other long term (current) drug therapy: Secondary | ICD-10-CM | POA: Diagnosis not present

## 2019-01-29 DIAGNOSIS — Z192 Hormone resistant malignancy status: Secondary | ICD-10-CM | POA: Diagnosis not present

## 2019-01-29 DIAGNOSIS — Z791 Long term (current) use of non-steroidal anti-inflammatories (NSAID): Secondary | ICD-10-CM | POA: Diagnosis not present

## 2019-01-29 DIAGNOSIS — Z9079 Acquired absence of other genital organ(s): Secondary | ICD-10-CM | POA: Diagnosis not present

## 2019-01-29 DIAGNOSIS — Z7952 Long term (current) use of systemic steroids: Secondary | ICD-10-CM | POA: Diagnosis not present

## 2019-01-29 DIAGNOSIS — Z809 Family history of malignant neoplasm, unspecified: Secondary | ICD-10-CM | POA: Diagnosis not present

## 2019-01-29 DIAGNOSIS — Z923 Personal history of irradiation: Secondary | ICD-10-CM | POA: Diagnosis not present

## 2019-01-29 DIAGNOSIS — Z9225 Personal history of immunosupression therapy: Secondary | ICD-10-CM | POA: Diagnosis not present

## 2019-01-30 DIAGNOSIS — C61 Malignant neoplasm of prostate: Secondary | ICD-10-CM | POA: Diagnosis not present

## 2019-01-30 DIAGNOSIS — Z9079 Acquired absence of other genital organ(s): Secondary | ICD-10-CM | POA: Diagnosis not present

## 2019-01-30 DIAGNOSIS — C7951 Secondary malignant neoplasm of bone: Secondary | ICD-10-CM | POA: Diagnosis not present

## 2019-01-30 DIAGNOSIS — Z7952 Long term (current) use of systemic steroids: Secondary | ICD-10-CM | POA: Diagnosis not present

## 2019-01-30 DIAGNOSIS — Z923 Personal history of irradiation: Secondary | ICD-10-CM | POA: Diagnosis not present

## 2019-01-30 DIAGNOSIS — Z87891 Personal history of nicotine dependence: Secondary | ICD-10-CM | POA: Diagnosis not present

## 2019-01-30 DIAGNOSIS — Z79899 Other long term (current) drug therapy: Secondary | ICD-10-CM | POA: Diagnosis not present

## 2019-01-30 DIAGNOSIS — Z510 Encounter for antineoplastic radiation therapy: Secondary | ICD-10-CM | POA: Diagnosis not present

## 2019-01-30 DIAGNOSIS — Z9225 Personal history of immunosupression therapy: Secondary | ICD-10-CM | POA: Diagnosis not present

## 2019-01-30 DIAGNOSIS — Z192 Hormone resistant malignancy status: Secondary | ICD-10-CM | POA: Diagnosis not present

## 2019-01-30 DIAGNOSIS — Z809 Family history of malignant neoplasm, unspecified: Secondary | ICD-10-CM | POA: Diagnosis not present

## 2019-01-30 DIAGNOSIS — Z791 Long term (current) use of non-steroidal anti-inflammatories (NSAID): Secondary | ICD-10-CM | POA: Diagnosis not present

## 2019-02-05 DIAGNOSIS — H353211 Exudative age-related macular degeneration, right eye, with active choroidal neovascularization: Secondary | ICD-10-CM | POA: Diagnosis not present

## 2019-02-20 DIAGNOSIS — C7951 Secondary malignant neoplasm of bone: Secondary | ICD-10-CM | POA: Diagnosis not present

## 2019-02-20 DIAGNOSIS — C61 Malignant neoplasm of prostate: Secondary | ICD-10-CM | POA: Diagnosis not present

## 2019-02-20 DIAGNOSIS — Z79899 Other long term (current) drug therapy: Secondary | ICD-10-CM | POA: Diagnosis not present

## 2019-02-20 DIAGNOSIS — Z192 Hormone resistant malignancy status: Secondary | ICD-10-CM | POA: Diagnosis not present

## 2019-02-20 DIAGNOSIS — Z9079 Acquired absence of other genital organ(s): Secondary | ICD-10-CM | POA: Diagnosis not present

## 2019-02-20 DIAGNOSIS — F418 Other specified anxiety disorders: Secondary | ICD-10-CM | POA: Diagnosis not present

## 2019-02-20 DIAGNOSIS — Z923 Personal history of irradiation: Secondary | ICD-10-CM | POA: Diagnosis not present

## 2019-02-20 DIAGNOSIS — M549 Dorsalgia, unspecified: Secondary | ICD-10-CM | POA: Diagnosis not present

## 2019-02-20 DIAGNOSIS — R0781 Pleurodynia: Secondary | ICD-10-CM | POA: Diagnosis not present

## 2019-02-20 DIAGNOSIS — M533 Sacrococcygeal disorders, not elsewhere classified: Secondary | ICD-10-CM | POA: Diagnosis not present

## 2019-03-19 DIAGNOSIS — H353211 Exudative age-related macular degeneration, right eye, with active choroidal neovascularization: Secondary | ICD-10-CM | POA: Diagnosis not present

## 2019-04-24 DIAGNOSIS — Z0001 Encounter for general adult medical examination with abnormal findings: Secondary | ICD-10-CM | POA: Diagnosis not present

## 2019-04-24 DIAGNOSIS — I10 Essential (primary) hypertension: Secondary | ICD-10-CM | POA: Diagnosis not present

## 2019-04-24 DIAGNOSIS — K219 Gastro-esophageal reflux disease without esophagitis: Secondary | ICD-10-CM | POA: Diagnosis not present

## 2019-04-24 DIAGNOSIS — E782 Mixed hyperlipidemia: Secondary | ICD-10-CM | POA: Diagnosis not present

## 2019-04-24 DIAGNOSIS — J301 Allergic rhinitis due to pollen: Secondary | ICD-10-CM | POA: Diagnosis not present

## 2019-05-17 DIAGNOSIS — H353211 Exudative age-related macular degeneration, right eye, with active choroidal neovascularization: Secondary | ICD-10-CM | POA: Diagnosis not present

## 2019-05-21 DIAGNOSIS — Z6828 Body mass index (BMI) 28.0-28.9, adult: Secondary | ICD-10-CM | POA: Diagnosis not present

## 2019-05-21 DIAGNOSIS — J3489 Other specified disorders of nose and nasal sinuses: Secondary | ICD-10-CM | POA: Diagnosis not present

## 2019-05-22 DIAGNOSIS — M549 Dorsalgia, unspecified: Secondary | ICD-10-CM | POA: Diagnosis not present

## 2019-05-22 DIAGNOSIS — R9721 Rising PSA following treatment for malignant neoplasm of prostate: Secondary | ICD-10-CM | POA: Diagnosis not present

## 2019-05-22 DIAGNOSIS — M533 Sacrococcygeal disorders, not elsewhere classified: Secondary | ICD-10-CM | POA: Diagnosis not present

## 2019-05-22 DIAGNOSIS — C61 Malignant neoplasm of prostate: Secondary | ICD-10-CM | POA: Diagnosis not present

## 2019-05-22 DIAGNOSIS — R0781 Pleurodynia: Secondary | ICD-10-CM | POA: Diagnosis not present

## 2019-05-22 DIAGNOSIS — Z79891 Long term (current) use of opiate analgesic: Secondary | ICD-10-CM | POA: Diagnosis not present

## 2019-05-22 DIAGNOSIS — Z9079 Acquired absence of other genital organ(s): Secondary | ICD-10-CM | POA: Diagnosis not present

## 2019-05-22 DIAGNOSIS — C7951 Secondary malignant neoplasm of bone: Secondary | ICD-10-CM | POA: Diagnosis not present

## 2019-05-22 DIAGNOSIS — Z79899 Other long term (current) drug therapy: Secondary | ICD-10-CM | POA: Diagnosis not present

## 2019-05-22 DIAGNOSIS — Z923 Personal history of irradiation: Secondary | ICD-10-CM | POA: Diagnosis not present

## 2019-05-22 DIAGNOSIS — Z5111 Encounter for antineoplastic chemotherapy: Secondary | ICD-10-CM | POA: Diagnosis not present

## 2019-05-22 DIAGNOSIS — F418 Other specified anxiety disorders: Secondary | ICD-10-CM | POA: Diagnosis not present

## 2019-05-22 DIAGNOSIS — Z192 Hormone resistant malignancy status: Secondary | ICD-10-CM | POA: Diagnosis not present

## 2019-05-29 DIAGNOSIS — M533 Sacrococcygeal disorders, not elsewhere classified: Secondary | ICD-10-CM | POA: Diagnosis not present

## 2019-05-29 DIAGNOSIS — F329 Major depressive disorder, single episode, unspecified: Secondary | ICD-10-CM | POA: Diagnosis not present

## 2019-05-29 DIAGNOSIS — F419 Anxiety disorder, unspecified: Secondary | ICD-10-CM | POA: Diagnosis not present

## 2019-05-29 DIAGNOSIS — C7951 Secondary malignant neoplasm of bone: Secondary | ICD-10-CM | POA: Diagnosis not present

## 2019-05-29 DIAGNOSIS — M549 Dorsalgia, unspecified: Secondary | ICD-10-CM | POA: Diagnosis not present

## 2019-05-29 DIAGNOSIS — R9721 Rising PSA following treatment for malignant neoplasm of prostate: Secondary | ICD-10-CM | POA: Diagnosis not present

## 2019-05-29 DIAGNOSIS — Z923 Personal history of irradiation: Secondary | ICD-10-CM | POA: Diagnosis not present

## 2019-05-29 DIAGNOSIS — Z79818 Long term (current) use of other agents affecting estrogen receptors and estrogen levels: Secondary | ICD-10-CM | POA: Diagnosis not present

## 2019-05-29 DIAGNOSIS — Z8546 Personal history of malignant neoplasm of prostate: Secondary | ICD-10-CM | POA: Diagnosis not present

## 2019-05-29 DIAGNOSIS — C61 Malignant neoplasm of prostate: Secondary | ICD-10-CM | POA: Diagnosis not present

## 2019-05-29 DIAGNOSIS — R0781 Pleurodynia: Secondary | ICD-10-CM | POA: Diagnosis not present

## 2019-06-06 DIAGNOSIS — C7951 Secondary malignant neoplasm of bone: Secondary | ICD-10-CM | POA: Diagnosis not present

## 2019-06-06 DIAGNOSIS — C61 Malignant neoplasm of prostate: Secondary | ICD-10-CM | POA: Diagnosis not present

## 2019-06-06 DIAGNOSIS — Z6830 Body mass index (BMI) 30.0-30.9, adult: Secondary | ICD-10-CM | POA: Diagnosis not present

## 2019-06-11 DIAGNOSIS — C7951 Secondary malignant neoplasm of bone: Secondary | ICD-10-CM | POA: Diagnosis not present

## 2019-06-13 DIAGNOSIS — C7951 Secondary malignant neoplasm of bone: Secondary | ICD-10-CM | POA: Diagnosis not present

## 2019-06-13 DIAGNOSIS — C61 Malignant neoplasm of prostate: Secondary | ICD-10-CM | POA: Diagnosis not present

## 2019-06-26 DIAGNOSIS — Z79899 Other long term (current) drug therapy: Secondary | ICD-10-CM | POA: Diagnosis not present

## 2019-06-26 DIAGNOSIS — M549 Dorsalgia, unspecified: Secondary | ICD-10-CM | POA: Diagnosis not present

## 2019-06-26 DIAGNOSIS — C61 Malignant neoplasm of prostate: Secondary | ICD-10-CM | POA: Diagnosis not present

## 2019-06-26 DIAGNOSIS — Z9079 Acquired absence of other genital organ(s): Secondary | ICD-10-CM | POA: Diagnosis not present

## 2019-06-26 DIAGNOSIS — C7951 Secondary malignant neoplasm of bone: Secondary | ICD-10-CM | POA: Diagnosis not present

## 2019-06-26 DIAGNOSIS — Z5111 Encounter for antineoplastic chemotherapy: Secondary | ICD-10-CM | POA: Diagnosis not present

## 2019-06-26 DIAGNOSIS — Z192 Hormone resistant malignancy status: Secondary | ICD-10-CM | POA: Diagnosis not present

## 2019-06-26 DIAGNOSIS — E876 Hypokalemia: Secondary | ICD-10-CM | POA: Diagnosis not present

## 2019-07-02 DIAGNOSIS — C61 Malignant neoplasm of prostate: Secondary | ICD-10-CM | POA: Diagnosis not present

## 2019-07-02 DIAGNOSIS — C7951 Secondary malignant neoplasm of bone: Secondary | ICD-10-CM | POA: Diagnosis not present

## 2019-07-04 DIAGNOSIS — C7951 Secondary malignant neoplasm of bone: Secondary | ICD-10-CM | POA: Diagnosis not present

## 2019-07-04 DIAGNOSIS — C61 Malignant neoplasm of prostate: Secondary | ICD-10-CM | POA: Diagnosis not present

## 2019-07-08 DIAGNOSIS — C61 Malignant neoplasm of prostate: Secondary | ICD-10-CM | POA: Diagnosis not present

## 2019-07-08 DIAGNOSIS — C7951 Secondary malignant neoplasm of bone: Secondary | ICD-10-CM | POA: Diagnosis not present

## 2019-07-09 DIAGNOSIS — C61 Malignant neoplasm of prostate: Secondary | ICD-10-CM | POA: Diagnosis not present

## 2019-07-09 DIAGNOSIS — C7951 Secondary malignant neoplasm of bone: Secondary | ICD-10-CM | POA: Diagnosis not present

## 2019-07-10 DIAGNOSIS — C61 Malignant neoplasm of prostate: Secondary | ICD-10-CM | POA: Diagnosis not present

## 2019-07-10 DIAGNOSIS — C7951 Secondary malignant neoplasm of bone: Secondary | ICD-10-CM | POA: Diagnosis not present

## 2019-07-11 DIAGNOSIS — C61 Malignant neoplasm of prostate: Secondary | ICD-10-CM | POA: Diagnosis not present

## 2019-07-11 DIAGNOSIS — C7951 Secondary malignant neoplasm of bone: Secondary | ICD-10-CM | POA: Diagnosis not present

## 2019-07-15 DIAGNOSIS — C61 Malignant neoplasm of prostate: Secondary | ICD-10-CM | POA: Diagnosis not present

## 2019-07-15 DIAGNOSIS — C7951 Secondary malignant neoplasm of bone: Secondary | ICD-10-CM | POA: Diagnosis not present

## 2019-07-17 DIAGNOSIS — Z923 Personal history of irradiation: Secondary | ICD-10-CM | POA: Diagnosis not present

## 2019-07-17 DIAGNOSIS — Z79891 Long term (current) use of opiate analgesic: Secondary | ICD-10-CM | POA: Diagnosis not present

## 2019-07-17 DIAGNOSIS — Z5111 Encounter for antineoplastic chemotherapy: Secondary | ICD-10-CM | POA: Diagnosis not present

## 2019-07-17 DIAGNOSIS — Z192 Hormone resistant malignancy status: Secondary | ICD-10-CM | POA: Diagnosis not present

## 2019-07-17 DIAGNOSIS — C7951 Secondary malignant neoplasm of bone: Secondary | ICD-10-CM | POA: Diagnosis not present

## 2019-07-17 DIAGNOSIS — G893 Neoplasm related pain (acute) (chronic): Secondary | ICD-10-CM | POA: Diagnosis not present

## 2019-07-17 DIAGNOSIS — R079 Chest pain, unspecified: Secondary | ICD-10-CM | POA: Diagnosis not present

## 2019-07-17 DIAGNOSIS — C61 Malignant neoplasm of prostate: Secondary | ICD-10-CM | POA: Diagnosis not present

## 2019-07-17 DIAGNOSIS — T451X5A Adverse effect of antineoplastic and immunosuppressive drugs, initial encounter: Secondary | ICD-10-CM | POA: Diagnosis not present

## 2019-07-17 DIAGNOSIS — Z9079 Acquired absence of other genital organ(s): Secondary | ICD-10-CM | POA: Diagnosis not present

## 2019-07-23 DIAGNOSIS — R112 Nausea with vomiting, unspecified: Secondary | ICD-10-CM | POA: Diagnosis not present

## 2019-07-23 DIAGNOSIS — C61 Malignant neoplasm of prostate: Secondary | ICD-10-CM | POA: Diagnosis not present

## 2019-07-23 DIAGNOSIS — C7951 Secondary malignant neoplasm of bone: Secondary | ICD-10-CM | POA: Diagnosis not present

## 2019-07-23 DIAGNOSIS — I10 Essential (primary) hypertension: Secondary | ICD-10-CM | POA: Diagnosis not present

## 2019-07-23 DIAGNOSIS — Z9221 Personal history of antineoplastic chemotherapy: Secondary | ICD-10-CM | POA: Diagnosis not present

## 2019-07-23 DIAGNOSIS — R1110 Vomiting, unspecified: Secondary | ICD-10-CM | POA: Diagnosis not present

## 2019-07-23 DIAGNOSIS — Z79899 Other long term (current) drug therapy: Secondary | ICD-10-CM | POA: Diagnosis not present

## 2019-08-07 DIAGNOSIS — Z923 Personal history of irradiation: Secondary | ICD-10-CM | POA: Diagnosis not present

## 2019-08-07 DIAGNOSIS — Z87898 Personal history of other specified conditions: Secondary | ICD-10-CM | POA: Diagnosis not present

## 2019-08-07 DIAGNOSIS — Z9079 Acquired absence of other genital organ(s): Secondary | ICD-10-CM | POA: Diagnosis not present

## 2019-08-07 DIAGNOSIS — Z192 Hormone resistant malignancy status: Secondary | ICD-10-CM | POA: Diagnosis not present

## 2019-08-07 DIAGNOSIS — C61 Malignant neoplasm of prostate: Secondary | ICD-10-CM | POA: Diagnosis not present

## 2019-08-07 DIAGNOSIS — F329 Major depressive disorder, single episode, unspecified: Secondary | ICD-10-CM | POA: Diagnosis not present

## 2019-08-07 DIAGNOSIS — R112 Nausea with vomiting, unspecified: Secondary | ICD-10-CM | POA: Diagnosis not present

## 2019-08-07 DIAGNOSIS — R45851 Suicidal ideations: Secondary | ICD-10-CM | POA: Diagnosis not present

## 2019-08-07 DIAGNOSIS — C7951 Secondary malignant neoplasm of bone: Secondary | ICD-10-CM | POA: Diagnosis not present

## 2019-08-07 DIAGNOSIS — Z5111 Encounter for antineoplastic chemotherapy: Secondary | ICD-10-CM | POA: Diagnosis not present

## 2019-08-16 DIAGNOSIS — E876 Hypokalemia: Secondary | ICD-10-CM | POA: Diagnosis not present

## 2019-08-16 DIAGNOSIS — N179 Acute kidney failure, unspecified: Secondary | ICD-10-CM | POA: Diagnosis not present

## 2019-08-16 DIAGNOSIS — E871 Hypo-osmolality and hyponatremia: Secondary | ICD-10-CM | POA: Diagnosis not present

## 2019-08-16 DIAGNOSIS — D701 Agranulocytosis secondary to cancer chemotherapy: Secondary | ICD-10-CM | POA: Diagnosis not present

## 2019-08-16 DIAGNOSIS — Z20822 Contact with and (suspected) exposure to covid-19: Secondary | ICD-10-CM | POA: Diagnosis not present

## 2019-08-16 DIAGNOSIS — F3340 Major depressive disorder, recurrent, in remission, unspecified: Secondary | ICD-10-CM | POA: Diagnosis not present

## 2019-08-16 DIAGNOSIS — E860 Dehydration: Secondary | ICD-10-CM | POA: Diagnosis not present

## 2019-08-16 DIAGNOSIS — G620 Drug-induced polyneuropathy: Secondary | ICD-10-CM | POA: Diagnosis not present

## 2019-08-16 DIAGNOSIS — T445X5A Adverse effect of predominantly beta-adrenoreceptor agonists, initial encounter: Secondary | ICD-10-CM | POA: Diagnosis not present

## 2019-08-16 DIAGNOSIS — D6481 Anemia due to antineoplastic chemotherapy: Secondary | ICD-10-CM | POA: Diagnosis not present

## 2019-08-16 DIAGNOSIS — C7951 Secondary malignant neoplasm of bone: Secondary | ICD-10-CM | POA: Diagnosis not present

## 2019-08-16 DIAGNOSIS — T451X5A Adverse effect of antineoplastic and immunosuppressive drugs, initial encounter: Secondary | ICD-10-CM | POA: Diagnosis not present

## 2019-08-16 DIAGNOSIS — N485 Ulcer of penis: Secondary | ICD-10-CM | POA: Diagnosis not present

## 2019-08-16 DIAGNOSIS — E785 Hyperlipidemia, unspecified: Secondary | ICD-10-CM | POA: Diagnosis not present

## 2019-08-16 DIAGNOSIS — I10 Essential (primary) hypertension: Secondary | ICD-10-CM | POA: Diagnosis not present

## 2019-08-16 DIAGNOSIS — J301 Allergic rhinitis due to pollen: Secondary | ICD-10-CM | POA: Diagnosis not present

## 2019-08-24 DIAGNOSIS — Z6825 Body mass index (BMI) 25.0-25.9, adult: Secondary | ICD-10-CM | POA: Diagnosis not present

## 2019-08-24 DIAGNOSIS — N179 Acute kidney failure, unspecified: Secondary | ICD-10-CM | POA: Diagnosis not present

## 2019-08-24 DIAGNOSIS — C61 Malignant neoplasm of prostate: Secondary | ICD-10-CM | POA: Diagnosis not present

## 2019-08-24 DIAGNOSIS — D649 Anemia, unspecified: Secondary | ICD-10-CM | POA: Diagnosis not present

## 2019-08-28 DIAGNOSIS — C61 Malignant neoplasm of prostate: Secondary | ICD-10-CM | POA: Diagnosis not present

## 2019-08-28 DIAGNOSIS — Z5111 Encounter for antineoplastic chemotherapy: Secondary | ICD-10-CM | POA: Diagnosis not present

## 2019-08-28 DIAGNOSIS — Z923 Personal history of irradiation: Secondary | ICD-10-CM | POA: Diagnosis not present

## 2019-08-28 DIAGNOSIS — Z9079 Acquired absence of other genital organ(s): Secondary | ICD-10-CM | POA: Diagnosis not present

## 2019-08-28 DIAGNOSIS — Z192 Hormone resistant malignancy status: Secondary | ICD-10-CM | POA: Diagnosis not present

## 2019-08-28 DIAGNOSIS — C7951 Secondary malignant neoplasm of bone: Secondary | ICD-10-CM | POA: Diagnosis not present

## 2019-08-28 DIAGNOSIS — Z79818 Long term (current) use of other agents affecting estrogen receptors and estrogen levels: Secondary | ICD-10-CM | POA: Diagnosis not present

## 2019-08-28 DIAGNOSIS — Z79899 Other long term (current) drug therapy: Secondary | ICD-10-CM | POA: Diagnosis not present

## 2019-08-28 DIAGNOSIS — F329 Major depressive disorder, single episode, unspecified: Secondary | ICD-10-CM | POA: Diagnosis not present

## 2019-08-28 DIAGNOSIS — R45851 Suicidal ideations: Secondary | ICD-10-CM | POA: Diagnosis not present

## 2019-10-17 DIAGNOSIS — I10 Essential (primary) hypertension: Secondary | ICD-10-CM | POA: Diagnosis not present

## 2019-10-17 DIAGNOSIS — E782 Mixed hyperlipidemia: Secondary | ICD-10-CM | POA: Diagnosis not present

## 2019-10-17 DIAGNOSIS — R946 Abnormal results of thyroid function studies: Secondary | ICD-10-CM | POA: Diagnosis not present

## 2019-10-17 DIAGNOSIS — K219 Gastro-esophageal reflux disease without esophagitis: Secondary | ICD-10-CM | POA: Diagnosis not present

## 2019-10-21 DIAGNOSIS — Z1331 Encounter for screening for depression: Secondary | ICD-10-CM | POA: Diagnosis not present

## 2019-10-21 DIAGNOSIS — E782 Mixed hyperlipidemia: Secondary | ICD-10-CM | POA: Diagnosis not present

## 2019-10-21 DIAGNOSIS — Z1389 Encounter for screening for other disorder: Secondary | ICD-10-CM | POA: Diagnosis not present

## 2019-10-21 DIAGNOSIS — C61 Malignant neoplasm of prostate: Secondary | ICD-10-CM | POA: Diagnosis not present

## 2019-10-21 DIAGNOSIS — K219 Gastro-esophageal reflux disease without esophagitis: Secondary | ICD-10-CM | POA: Diagnosis not present

## 2019-10-21 DIAGNOSIS — I10 Essential (primary) hypertension: Secondary | ICD-10-CM | POA: Diagnosis not present

## 2019-11-20 DIAGNOSIS — C61 Malignant neoplasm of prostate: Secondary | ICD-10-CM | POA: Diagnosis not present

## 2019-11-20 DIAGNOSIS — Z7952 Long term (current) use of systemic steroids: Secondary | ICD-10-CM | POA: Diagnosis not present

## 2019-11-20 DIAGNOSIS — Z79899 Other long term (current) drug therapy: Secondary | ICD-10-CM | POA: Diagnosis not present

## 2019-11-20 DIAGNOSIS — Z9079 Acquired absence of other genital organ(s): Secondary | ICD-10-CM | POA: Diagnosis not present

## 2019-11-20 DIAGNOSIS — Z5111 Encounter for antineoplastic chemotherapy: Secondary | ICD-10-CM | POA: Diagnosis not present

## 2019-11-20 DIAGNOSIS — Z87442 Personal history of urinary calculi: Secondary | ICD-10-CM | POA: Diagnosis not present

## 2019-11-20 DIAGNOSIS — Z791 Long term (current) use of non-steroidal anti-inflammatories (NSAID): Secondary | ICD-10-CM | POA: Diagnosis not present

## 2019-11-20 DIAGNOSIS — C7951 Secondary malignant neoplasm of bone: Secondary | ICD-10-CM | POA: Diagnosis not present

## 2019-12-06 DIAGNOSIS — H353211 Exudative age-related macular degeneration, right eye, with active choroidal neovascularization: Secondary | ICD-10-CM | POA: Diagnosis not present

## 2019-12-18 DIAGNOSIS — C61 Malignant neoplasm of prostate: Secondary | ICD-10-CM | POA: Diagnosis not present

## 2019-12-18 DIAGNOSIS — Z192 Hormone resistant malignancy status: Secondary | ICD-10-CM | POA: Diagnosis not present

## 2019-12-18 DIAGNOSIS — C7951 Secondary malignant neoplasm of bone: Secondary | ICD-10-CM | POA: Diagnosis not present

## 2019-12-18 DIAGNOSIS — Z9079 Acquired absence of other genital organ(s): Secondary | ICD-10-CM | POA: Diagnosis not present

## 2020-01-08 DIAGNOSIS — H353211 Exudative age-related macular degeneration, right eye, with active choroidal neovascularization: Secondary | ICD-10-CM | POA: Diagnosis not present

## 2020-10-09 DEATH — deceased
